# Patient Record
Sex: Male | Born: 1950
Health system: Southern US, Community
[De-identification: ages and names within clinical notes are randomized; demographics above are authoritative.]

## PROBLEM LIST (undated history)

## (undated) DIAGNOSIS — M1990 Unspecified osteoarthritis, unspecified site: Secondary | ICD-10-CM

## (undated) HISTORY — PX: FRACTURE SURGERY: SHX138

---

## 2016-12-25 DIAGNOSIS — D62 Acute posthemorrhagic anemia: Secondary | ICD-10-CM | POA: Diagnosis not present

## 2016-12-25 DIAGNOSIS — K920 Hematemesis: Secondary | ICD-10-CM | POA: Diagnosis not present

## 2016-12-25 DIAGNOSIS — K922 Gastrointestinal hemorrhage, unspecified: Secondary | ICD-10-CM | POA: Diagnosis not present

## 2016-12-25 DIAGNOSIS — I959 Hypotension, unspecified: Secondary | ICD-10-CM

## 2016-12-26 DIAGNOSIS — K920 Hematemesis: Secondary | ICD-10-CM | POA: Diagnosis not present

## 2016-12-26 DIAGNOSIS — K922 Gastrointestinal hemorrhage, unspecified: Secondary | ICD-10-CM | POA: Diagnosis not present

## 2016-12-26 DIAGNOSIS — D62 Acute posthemorrhagic anemia: Secondary | ICD-10-CM | POA: Diagnosis not present

## 2016-12-26 DIAGNOSIS — I959 Hypotension, unspecified: Secondary | ICD-10-CM | POA: Diagnosis not present

## 2016-12-27 DIAGNOSIS — I959 Hypotension, unspecified: Secondary | ICD-10-CM | POA: Diagnosis not present

## 2016-12-27 DIAGNOSIS — K920 Hematemesis: Secondary | ICD-10-CM | POA: Diagnosis not present

## 2016-12-27 DIAGNOSIS — D62 Acute posthemorrhagic anemia: Secondary | ICD-10-CM | POA: Diagnosis not present

## 2016-12-27 DIAGNOSIS — K922 Gastrointestinal hemorrhage, unspecified: Secondary | ICD-10-CM | POA: Diagnosis not present

## 2016-12-28 DIAGNOSIS — K920 Hematemesis: Secondary | ICD-10-CM | POA: Diagnosis not present

## 2016-12-28 DIAGNOSIS — D62 Acute posthemorrhagic anemia: Secondary | ICD-10-CM | POA: Diagnosis not present

## 2016-12-28 DIAGNOSIS — I959 Hypotension, unspecified: Secondary | ICD-10-CM | POA: Diagnosis not present

## 2016-12-28 DIAGNOSIS — K922 Gastrointestinal hemorrhage, unspecified: Secondary | ICD-10-CM | POA: Diagnosis not present

## 2016-12-28 LAB — HM COLONOSCOPY

## 2017-02-09 ENCOUNTER — Other Ambulatory Visit: Payer: Self-pay | Admitting: Orthopedic Surgery

## 2017-03-17 ENCOUNTER — Encounter (HOSPITAL_BASED_OUTPATIENT_CLINIC_OR_DEPARTMENT_OTHER): Payer: Self-pay | Admitting: *Deleted

## 2017-03-24 ENCOUNTER — Ambulatory Visit (HOSPITAL_BASED_OUTPATIENT_CLINIC_OR_DEPARTMENT_OTHER): Admission: RE | Admit: 2017-03-24 | Payer: Non-veteran care | Source: Ambulatory Visit | Admitting: Orthopedic Surgery

## 2017-03-24 HISTORY — DX: Unspecified osteoarthritis, unspecified site: M19.90

## 2017-03-24 SURGERY — ARTHROSCOPY, ANKLE, WITH FUSION
Anesthesia: General | Laterality: Right

## 2018-03-28 DIAGNOSIS — I10 Essential (primary) hypertension: Secondary | ICD-10-CM | POA: Diagnosis not present

## 2018-03-28 DIAGNOSIS — Z0000 Encounter for general adult medical examination without abnormal findings: Secondary | ICD-10-CM | POA: Diagnosis not present

## 2018-04-19 DIAGNOSIS — J208 Acute bronchitis due to other specified organisms: Secondary | ICD-10-CM | POA: Diagnosis not present

## 2018-08-15 DIAGNOSIS — D692 Other nonthrombocytopenic purpura: Secondary | ICD-10-CM | POA: Diagnosis not present

## 2018-08-15 DIAGNOSIS — L821 Other seborrheic keratosis: Secondary | ICD-10-CM | POA: Diagnosis not present

## 2018-08-15 DIAGNOSIS — L570 Actinic keratosis: Secondary | ICD-10-CM | POA: Diagnosis not present

## 2018-08-30 DIAGNOSIS — R1084 Generalized abdominal pain: Secondary | ICD-10-CM | POA: Diagnosis not present

## 2018-08-31 ENCOUNTER — Other Ambulatory Visit: Payer: Self-pay | Admitting: Legal Medicine

## 2018-08-31 DIAGNOSIS — R1084 Generalized abdominal pain: Secondary | ICD-10-CM

## 2018-09-01 ENCOUNTER — Other Ambulatory Visit: Payer: Self-pay | Admitting: Legal Medicine

## 2018-09-05 ENCOUNTER — Ambulatory Visit
Admission: RE | Admit: 2018-09-05 | Discharge: 2018-09-05 | Disposition: A | Discharge status: Home / Self Care | Payer: Non-veteran care | Source: Ambulatory Visit | Attending: Legal Medicine | Admitting: Legal Medicine

## 2018-09-05 DIAGNOSIS — R1084 Generalized abdominal pain: Secondary | ICD-10-CM

## 2018-09-05 DIAGNOSIS — R109 Unspecified abdominal pain: Secondary | ICD-10-CM | POA: Diagnosis not present

## 2018-09-13 DIAGNOSIS — R1084 Generalized abdominal pain: Secondary | ICD-10-CM | POA: Diagnosis not present

## 2018-09-28 DIAGNOSIS — Z6824 Body mass index (BMI) 24.0-24.9, adult: Secondary | ICD-10-CM | POA: Diagnosis not present

## 2018-09-28 DIAGNOSIS — Z23 Encounter for immunization: Secondary | ICD-10-CM | POA: Diagnosis not present

## 2018-09-28 DIAGNOSIS — K219 Gastro-esophageal reflux disease without esophagitis: Secondary | ICD-10-CM | POA: Diagnosis not present

## 2018-09-28 DIAGNOSIS — H906 Mixed conductive and sensorineural hearing loss, bilateral: Secondary | ICD-10-CM | POA: Diagnosis not present

## 2018-09-28 DIAGNOSIS — E782 Mixed hyperlipidemia: Secondary | ICD-10-CM | POA: Diagnosis not present

## 2018-10-03 DIAGNOSIS — L570 Actinic keratosis: Secondary | ICD-10-CM | POA: Diagnosis not present

## 2018-10-03 DIAGNOSIS — L821 Other seborrheic keratosis: Secondary | ICD-10-CM | POA: Diagnosis not present

## 2018-10-12 DIAGNOSIS — Z1211 Encounter for screening for malignant neoplasm of colon: Secondary | ICD-10-CM | POA: Diagnosis not present

## 2019-04-02 DIAGNOSIS — D692 Other nonthrombocytopenic purpura: Secondary | ICD-10-CM | POA: Diagnosis not present

## 2019-04-02 DIAGNOSIS — L821 Other seborrheic keratosis: Secondary | ICD-10-CM | POA: Diagnosis not present

## 2019-04-02 DIAGNOSIS — L570 Actinic keratosis: Secondary | ICD-10-CM | POA: Diagnosis not present

## 2019-06-20 DIAGNOSIS — Z0000 Encounter for general adult medical examination without abnormal findings: Secondary | ICD-10-CM | POA: Diagnosis not present

## 2019-06-20 DIAGNOSIS — Z23 Encounter for immunization: Secondary | ICD-10-CM | POA: Diagnosis not present

## 2019-06-20 DIAGNOSIS — E782 Mixed hyperlipidemia: Secondary | ICD-10-CM | POA: Diagnosis not present

## 2019-06-20 DIAGNOSIS — Z1159 Encounter for screening for other viral diseases: Secondary | ICD-10-CM | POA: Diagnosis not present

## 2019-06-20 DIAGNOSIS — K219 Gastro-esophageal reflux disease without esophagitis: Secondary | ICD-10-CM | POA: Diagnosis not present

## 2019-09-18 DIAGNOSIS — M25462 Effusion, left knee: Secondary | ICD-10-CM | POA: Diagnosis not present

## 2019-09-18 DIAGNOSIS — Z1159 Encounter for screening for other viral diseases: Secondary | ICD-10-CM | POA: Diagnosis not present

## 2019-10-09 DIAGNOSIS — M25462 Effusion, left knee: Secondary | ICD-10-CM | POA: Diagnosis not present

## 2019-10-09 DIAGNOSIS — Z23 Encounter for immunization: Secondary | ICD-10-CM | POA: Diagnosis not present

## 2019-10-30 DIAGNOSIS — M25562 Pain in left knee: Secondary | ICD-10-CM | POA: Diagnosis not present

## 2019-10-31 DIAGNOSIS — L570 Actinic keratosis: Secondary | ICD-10-CM | POA: Diagnosis not present

## 2019-12-24 DIAGNOSIS — Z1159 Encounter for screening for other viral diseases: Secondary | ICD-10-CM | POA: Diagnosis not present

## 2019-12-24 DIAGNOSIS — H906 Mixed conductive and sensorineural hearing loss, bilateral: Secondary | ICD-10-CM | POA: Diagnosis not present

## 2019-12-24 DIAGNOSIS — E782 Mixed hyperlipidemia: Secondary | ICD-10-CM | POA: Diagnosis not present

## 2019-12-24 DIAGNOSIS — K219 Gastro-esophageal reflux disease without esophagitis: Secondary | ICD-10-CM | POA: Diagnosis not present

## 2020-02-26 DIAGNOSIS — H2513 Age-related nuclear cataract, bilateral: Secondary | ICD-10-CM | POA: Diagnosis not present

## 2020-04-29 DIAGNOSIS — C44329 Squamous cell carcinoma of skin of other parts of face: Secondary | ICD-10-CM | POA: Diagnosis not present

## 2020-04-29 DIAGNOSIS — L570 Actinic keratosis: Secondary | ICD-10-CM | POA: Diagnosis not present

## 2020-05-21 DIAGNOSIS — C44329 Squamous cell carcinoma of skin of other parts of face: Secondary | ICD-10-CM | POA: Diagnosis not present

## 2020-05-25 HISTORY — PX: SKIN CANCER EXCISION: SHX779

## 2020-06-24 ENCOUNTER — Encounter: Payer: Self-pay | Admitting: Legal Medicine

## 2020-06-24 ENCOUNTER — Ambulatory Visit (INDEPENDENT_AMBULATORY_CARE_PROVIDER_SITE_OTHER): Payer: Medicare Other | Admitting: Legal Medicine

## 2020-06-24 ENCOUNTER — Other Ambulatory Visit: Payer: Self-pay

## 2020-06-24 VITALS — BP 120/60 | HR 69 | Temp 97.7°F | Resp 17 | Ht 73.0 in | Wt 194.2 lb

## 2020-06-24 DIAGNOSIS — E782 Mixed hyperlipidemia: Secondary | ICD-10-CM | POA: Diagnosis not present

## 2020-06-24 DIAGNOSIS — Z125 Encounter for screening for malignant neoplasm of prostate: Secondary | ICD-10-CM

## 2020-06-24 DIAGNOSIS — K219 Gastro-esophageal reflux disease without esophagitis: Secondary | ICD-10-CM | POA: Diagnosis not present

## 2020-06-24 DIAGNOSIS — H906 Mixed conductive and sensorineural hearing loss, bilateral: Secondary | ICD-10-CM | POA: Diagnosis not present

## 2020-06-24 DIAGNOSIS — K250 Acute gastric ulcer with hemorrhage: Secondary | ICD-10-CM

## 2020-06-24 DIAGNOSIS — K260 Acute duodenal ulcer with hemorrhage: Secondary | ICD-10-CM | POA: Insufficient documentation

## 2020-06-24 DIAGNOSIS — Z8711 Personal history of peptic ulcer disease: Secondary | ICD-10-CM | POA: Insufficient documentation

## 2020-06-24 HISTORY — DX: Mixed hyperlipidemia: E78.2

## 2020-06-24 HISTORY — DX: Acute gastric ulcer with hemorrhage: K25.0

## 2020-06-24 HISTORY — DX: Gastro-esophageal reflux disease without esophagitis: K21.9

## 2020-06-24 HISTORY — DX: Mixed conductive and sensorineural hearing loss, bilateral: H90.6

## 2020-06-24 NOTE — Progress Notes (Signed)
Subjective:  Patient ID: Adrian Allen, male    DOB: January 07, 1951  Age: 69 y.o. MRN: [id number]  Chief Complaint  Patient presents with  . Hyperlipidemia  . Gastroesophageal Reflux    HPI: chronic visit  Patient presents with hyperlipidemia.  Compliance with treatment has been good; patient takes medicines as directed, maintains low cholesterol diet, follows up as directed, and maintains exercise regimen.  Patient is using no meds without problems.   Patient has gastroesophageal reflux symptoms withesophagitis and LTRD.  The symptoms are mild intensity.  Length of symptoms 10 years.  Medicines include OTC.  Complications include none.   No current outpatient medications on file prior to visit.   No current facility-administered medications on file prior to visit.   Past Medical History:  Diagnosis Date  . Acute gastric ulcer with hemorrhage 06/24/2020  . Arthritis    right ankle  . Gastroesophageal reflux disease 06/24/2020  . Mixed conductive and sensorineural hearing loss, bilateral 06/24/2020  . Mixed hyperlipidemia 06/24/2020   Past Surgical History:  Procedure Laterality Date  . FRACTURE SURGERY     right ankle surgery in 1974  . SKIN CANCER EXCISION Left 05/25/2020    Family History  Problem Relation Age of Onset  . Stomach cancer Father   . Heart Problems Brother    Social History   Socioeconomic History  . Marital status: Married    Spouse name: Not on file  . Number of children: 0  . Years of education: Not on file  . Highest education level: Not on file  Occupational History  . Occupation: Retired  Tobacco Use  . Smoking status: Former Smoker    Types: Cigarettes    Quit date: 2019    Years since quitting: 2.5  . Smokeless tobacco: Never Used  Substance and Sexual Activity  . Alcohol use: Not Currently  . Drug use: No  . Sexual activity: Yes    Partners: Female  Other Topics Concern  . Not on file  Social History Narrative  . Not on file    Social Determinants of Health   Financial Resource Strain:   . Difficulty of Paying Living Expenses:   Food Insecurity:   . Worried About Charity fundraiser in the Last Year:   . Arboriculturist in the Last Year:   Transportation Needs:   . Film/video editor (Medical):   Marland Kitchen Lack of Transportation (Non-Medical):   Physical Activity:   . Days of Exercise per Week:   . Minutes of Exercise per Session:   Stress:   . Feeling of Stress :   Social Connections:   . Frequency of Communication with Friends and Family:   . Frequency of Social Gatherings with Friends and Family:   . Attends Religious Services:   . Active Member of Clubs or Organizations:   . Attends Archivist Meetings:   Marland Kitchen Marital Status:     Review of Systems  Constitutional: Negative.   HENT: Negative.   Eyes: Negative.   Respiratory: Negative.   Cardiovascular: Negative.   Gastrointestinal: Negative.   Endocrine: Negative.   Genitourinary: Negative.   Musculoskeletal: Negative.   Skin: Negative.   Neurological: Negative.   Psychiatric/Behavioral: Negative.      Objective:  BP 120/60   Pulse 69   Temp 97.7 F (36.5 C) (Temporal)   Resp 17   Ht 6\' 1"  (1.854 m)   Wt 194 lb 3.2 oz (88.1 kg)   SpO2  98%   BMI 25.62 kg/m   BP/Weight 06/24/2020 03/24/2017 1/60/1093  Systolic BP 235 - -  Diastolic BP 60 - -  Wt. (Lbs) 194.2 - 218  BMI 25.62 28.76 -    Physical Exam Vitals reviewed.  Constitutional:      Appearance: Normal appearance.  HENT:     Head: Normocephalic and atraumatic.     Right Ear: Tympanic membrane normal.     Left Ear: Tympanic membrane normal.     Nose: Nose normal.     Mouth/Throat:     Mouth: Mucous membranes are dry.  Eyes:     Extraocular Movements: Extraocular movements intact.     Pupils: Pupils are equal, round, and reactive to light.  Cardiovascular:     Rate and Rhythm: Normal rate and regular rhythm.     Pulses: Normal pulses.     Heart sounds: Normal  heart sounds.  Pulmonary:     Breath sounds: Normal breath sounds.  Abdominal:     General: Abdomen is flat.     Palpations: Abdomen is soft.  Musculoskeletal:        General: Normal range of motion.     Cervical back: Normal range of motion and neck supple.  Skin:    General: Skin is warm and dry.     Capillary Refill: Capillary refill takes less than 2 seconds.  Neurological:     General: No focal deficit present.     Mental Status: He is alert and oriented to person, place, and time.  Psychiatric:        Mood and Affect: Mood normal.        Thought Content: Thought content normal.        Judgment: Judgment normal.       No results found for: WBC, HGB, HCT, PLT, GLUCOSE, CHOL, TRIG, HDL, LDLDIRECT, LDLCALC, ALT, AST, NA, K, CL, CREATININE, BUN, CO2, TSH, PSA, INR, GLUF, HGBA1C, MICROALBUR    Assessment & Plan:   1. Mixed conductive and sensorineural hearing loss, bilateral Patient has hearing aids.  2. Gastroesophageal reflux disease without esophagitis - CBC with Differential/Platelet - Comprehensive metabolic panel Plan of care was formulated today.  She is doing well.  A plan of care was formulated using patient exam, tests and other sources to optimize care using evidence based information.  Recommend no smoking, no eating after supper, avoid fatty foods, elevate Head of bed, avoid tight fitting clothing.  Continue on OTC.  3. Mixed hyperlipidemia - TSH; Future - Lipid Panel AN INDIVIDUAL CARE PLAN for hyperlipidemia/ cholesterol was established and reinforced today.  The patient's status was assessed using clinical findings on exam, lab and other diagnostic tests. The patient's disease status was assessed based on evidence-based guidelines and found to be well controlled. MEDICATIONS were reviewed. SELF MANAGEMENT GOALS have been discussed and patient's success at attaining the goal of low cholesterol was assessed. RECOMMENDATION given include regular exercise 3  days a week and low cholesterol/low fat diet. CLINICAL SUMMARY including written plan to identify barriers unique to the patient due to social or economic  reasons was discussed.  4. Screening for malignant neoplasm of prostate - PSA Patient has BPH and needs PSA         Follow-up: Return in about 6 months (around 12/25/2020) for chronic.  An After Visit Summary was printed and given to the patient.  [place] [phone]

## 2020-06-26 LAB — CBC WITH DIFFERENTIAL/PLATELET
Basophils Absolute: 0.1 10*3/uL (ref 0.0–0.2)
Basos: 1 %
EOS (ABSOLUTE): 0.2 10*3/uL (ref 0.0–0.4)
Eos: 2 %
Hematocrit: 43.3 % (ref 37.5–51.0)
Hemoglobin: 15.2 g/dL (ref 13.0–17.7)
Immature Grans (Abs): 0 10*3/uL (ref 0.0–0.1)
Immature Granulocytes: 1 %
Lymphocytes Absolute: 2 10*3/uL (ref 0.7–3.1)
Lymphs: 26 %
MCH: 33.3 pg — ABNORMAL HIGH (ref 26.6–33.0)
MCHC: 35.1 g/dL (ref 31.5–35.7)
MCV: 95 fL (ref 79–97)
Monocytes Absolute: 0.7 10*3/uL (ref 0.1–0.9)
Monocytes: 9 %
Neutrophils Absolute: 4.8 10*3/uL (ref 1.4–7.0)
Neutrophils: 61 %
Platelets: 149 10*3/uL — ABNORMAL LOW (ref 150–450)
RBC: 4.57 x10E6/uL (ref 4.14–5.80)
RDW: 11.7 % (ref 11.6–15.4)
WBC: 7.7 10*3/uL (ref 3.4–10.8)

## 2020-06-26 LAB — COMPREHENSIVE METABOLIC PANEL
ALT: 13 IU/L (ref 0–44)
AST: 20 IU/L (ref 0–40)
Albumin/Globulin Ratio: 1.7 (ref 1.2–2.2)
Albumin: 4.3 g/dL (ref 3.8–4.8)
Alkaline Phosphatase: 111 IU/L (ref 48–121)
BUN/Creatinine Ratio: 12 (ref 10–24)
BUN: 15 mg/dL (ref 8–27)
Bilirubin Total: 0.8 mg/dL (ref 0.0–1.2)
CO2: 25 mmol/L (ref 20–29)
Calcium: 9.2 mg/dL (ref 8.6–10.2)
Chloride: 100 mmol/L (ref 96–106)
Creatinine, Ser: 1.22 mg/dL (ref 0.76–1.27)
GFR calc Af Amer: 70 mL/min/{1.73_m2} (ref >59)
GFR calc non Af Amer: 61 mL/min/{1.73_m2} (ref >59)
Globulin, Total: 2.5 g/dL (ref 1.5–4.5)
Glucose: 92 mg/dL (ref 65–99)
Potassium: 5.2 mmol/L (ref 3.5–5.2)
Sodium: 137 mmol/L (ref 134–144)
Total Protein: 6.8 g/dL (ref 6.0–8.5)

## 2020-06-26 LAB — LIPID PANEL
Chol/HDL Ratio: 3.7 ratio (ref 0.0–5.0)
Cholesterol, Total: 153 mg/dL (ref 100–199)
HDL: 41 mg/dL (ref >39)
LDL Chol Calc (NIH): 94 mg/dL (ref 0–99)
Triglycerides: 99 mg/dL (ref 0–149)
VLDL Cholesterol Cal: 18 mg/dL (ref 5–40)

## 2020-06-26 LAB — CARDIOVASCULAR RISK ASSESSMENT

## 2020-06-26 LAB — PSA: Prostate Specific Ag, Serum: 0.6 ng/mL (ref 0.0–4.0)

## 2020-06-26 LAB — TSH: TSH: 1.98 u[IU]/mL (ref 0.450–4.500)

## 2020-06-26 LAB — SPECIMEN STATUS REPORT

## 2020-06-26 NOTE — Progress Notes (Signed)
Cbc OK, kidney and liver tests normal, Cholesterol normal, PSA 0.6 lp

## 2020-06-26 NOTE — Progress Notes (Signed)
TSH normal, continue same dose of thyroid lp

## 2020-09-12 ENCOUNTER — Ambulatory Visit (INDEPENDENT_AMBULATORY_CARE_PROVIDER_SITE_OTHER): Payer: Medicare Other

## 2020-09-12 ENCOUNTER — Encounter: Payer: Self-pay | Admitting: Legal Medicine

## 2020-09-12 DIAGNOSIS — Z23 Encounter for immunization: Secondary | ICD-10-CM

## 2020-09-21 DIAGNOSIS — S42114A Nondisplaced fracture of body of scapula, right shoulder, initial encounter for closed fracture: Secondary | ICD-10-CM | POA: Diagnosis not present

## 2020-09-21 DIAGNOSIS — S27321A Contusion of lung, unilateral, initial encounter: Secondary | ICD-10-CM | POA: Diagnosis not present

## 2020-09-21 DIAGNOSIS — S91312A Laceration without foreign body, left foot, initial encounter: Secondary | ICD-10-CM | POA: Diagnosis not present

## 2020-09-21 DIAGNOSIS — S299XXA Unspecified injury of thorax, initial encounter: Secondary | ICD-10-CM | POA: Diagnosis not present

## 2020-09-21 DIAGNOSIS — S0081XA Abrasion of other part of head, initial encounter: Secondary | ICD-10-CM | POA: Diagnosis not present

## 2020-09-21 DIAGNOSIS — S82832A Other fracture of upper and lower end of left fibula, initial encounter for closed fracture: Secondary | ICD-10-CM | POA: Diagnosis not present

## 2020-09-21 DIAGNOSIS — S0990XA Unspecified injury of head, initial encounter: Secondary | ICD-10-CM | POA: Diagnosis not present

## 2020-09-21 DIAGNOSIS — S82402A Unspecified fracture of shaft of left fibula, initial encounter for closed fracture: Secondary | ICD-10-CM | POA: Diagnosis not present

## 2020-09-21 DIAGNOSIS — S92335A Nondisplaced fracture of third metatarsal bone, left foot, initial encounter for closed fracture: Secondary | ICD-10-CM | POA: Diagnosis not present

## 2020-09-21 DIAGNOSIS — S92315B Nondisplaced fracture of first metatarsal bone, left foot, initial encounter for open fracture: Secondary | ICD-10-CM | POA: Diagnosis not present

## 2020-09-21 DIAGNOSIS — Z043 Encounter for examination and observation following other accident: Secondary | ICD-10-CM | POA: Diagnosis not present

## 2020-09-21 DIAGNOSIS — S0091XA Abrasion of unspecified part of head, initial encounter: Secondary | ICD-10-CM | POA: Diagnosis not present

## 2020-09-21 DIAGNOSIS — S42101A Fracture of unspecified part of scapula, right shoulder, initial encounter for closed fracture: Secondary | ICD-10-CM | POA: Diagnosis not present

## 2020-09-21 DIAGNOSIS — S3991XA Unspecified injury of abdomen, initial encounter: Secondary | ICD-10-CM | POA: Diagnosis not present

## 2020-09-23 DIAGNOSIS — S42114A Nondisplaced fracture of body of scapula, right shoulder, initial encounter for closed fracture: Secondary | ICD-10-CM | POA: Diagnosis not present

## 2020-09-23 DIAGNOSIS — S92335A Nondisplaced fracture of third metatarsal bone, left foot, initial encounter for closed fracture: Secondary | ICD-10-CM | POA: Diagnosis not present

## 2020-09-23 DIAGNOSIS — S82832A Other fracture of upper and lower end of left fibula, initial encounter for closed fracture: Secondary | ICD-10-CM | POA: Diagnosis not present

## 2020-09-23 DIAGNOSIS — M1990 Unspecified osteoarthritis, unspecified site: Secondary | ICD-10-CM | POA: Insufficient documentation

## 2020-09-23 HISTORY — DX: Nondisplaced fracture of third metatarsal bone, left foot, initial encounter for closed fracture: S92.335A

## 2020-09-23 HISTORY — DX: Nondisplaced fracture of body of scapula, right shoulder, initial encounter for closed fracture: S42.114A

## 2020-09-23 HISTORY — DX: Other fracture of upper and lower end of left fibula, initial encounter for closed fracture: S82.832A

## 2020-09-26 ENCOUNTER — Ambulatory Visit (INDEPENDENT_AMBULATORY_CARE_PROVIDER_SITE_OTHER): Payer: Medicare Other | Admitting: Legal Medicine

## 2020-09-26 ENCOUNTER — Encounter: Payer: Self-pay | Admitting: Legal Medicine

## 2020-09-26 ENCOUNTER — Other Ambulatory Visit: Payer: Self-pay

## 2020-09-26 VITALS — BP 130/70 | HR 74 | Temp 98.2°F | Resp 17 | Ht 73.0 in | Wt 194.0 lb

## 2020-09-26 DIAGNOSIS — S82832A Other fracture of upper and lower end of left fibula, initial encounter for closed fracture: Secondary | ICD-10-CM

## 2020-09-26 DIAGNOSIS — S92335A Nondisplaced fracture of third metatarsal bone, left foot, initial encounter for closed fracture: Secondary | ICD-10-CM

## 2020-09-26 DIAGNOSIS — S42114A Nondisplaced fracture of body of scapula, right shoulder, initial encounter for closed fracture: Secondary | ICD-10-CM | POA: Diagnosis not present

## 2020-09-26 MED ORDER — HYDROMORPHONE HCL 2 MG PO TABS: 2.0000 mg | ORAL_TABLET | ORAL | 0 refills | Status: DC | PRN

## 2020-09-26 NOTE — Progress Notes (Signed)
Subjective:  Patient ID: Adrian Allen, male    DOB: [birth year]  Age: 69 y.o. MRN: [id number]  Chief Complaint  Patient presents with   Follow-up    Patient went to ED on 09/21/2020.   hospital noted reviewed  HPI: patient fell off his camper on 09/21/2020 and has left fibular fracture closed,right scapular fracture.  HE HAS CUT ON TOE.  He has multiped abrasion on scalp, no infection. No LOC  he has seen Dr. Ronnie Derby for his fractures. Current Outpatient Medications on File Prior to Visit  Medication Sig Dispense Refill   cephALEXin (KEFLEX) 500 MG capsule Take 500 mg by mouth every 6 (six) hours.     ondansetron (ZOFRAN-ODT) 4 MG disintegrating tablet Take 4 mg by mouth every 6 (six) hours as needed.     oxyCODONE-acetaminophen (PERCOCET/ROXICET) 5-325 MG tablet Take 1 tablet by mouth every 4 (four) hours.     No current facility-administered medications on file prior to visit.   Past Medical History:  Diagnosis Date   Acute gastric ulcer with hemorrhage 06/24/2020   Arthritis    right ankle   Gastroesophageal reflux disease 06/24/2020   Mixed conductive and sensorineural hearing loss, bilateral 06/24/2020   Mixed hyperlipidemia 06/24/2020   Past Surgical History:  Procedure Laterality Date   FRACTURE SURGERY     right ankle surgery in [place] Left 05/25/2020    Family History  Problem Relation Age of Onset   Stomach cancer Father    Heart Problems Brother    Social History   Socioeconomic History   Marital status: Married    Spouse name: Not on file   Number of children: 0   Years of education: Not on file   Highest education level: Not on file  Occupational History   Occupation: Retired  Tobacco Use   Smoking status: Former Smoker    Types: Cigarettes    Quit date: 2019    Years since quitting: 2.7   Smokeless tobacco: Never Used  Substance and Sexual Activity   Alcohol use: Not Currently   Drug use: No   Sexual  activity: Yes    Partners: Female  Other Topics Concern   Not on file  Social History Narrative   Not on file   Social Determinants of Health   Financial Resource Strain:    Difficulty of Paying Living Expenses: Not on file  Food Insecurity:    Worried About Charity fundraiser in the Last Year: Not on file   [employer] of Food in the Last Year: Not on file  Transportation Needs:    Lack of Transportation (Medical): Not on file   Lack of Transportation (Non-Medical): Not on file  Physical Activity:    Days of Exercise per Week: Not on file   Minutes of Exercise per Session: Not on file  Stress:    Feeling of Stress : Not on file  Social Connections:    Frequency of Communication with Friends and Family: Not on file   Frequency of Social Gatherings with Friends and Family: Not on file   Attends Religious Services: Not on file   Active Member of Clubs or Organizations: Not on file   Attends Archivist Meetings: Not on file   Marital Status: Not on file    Review of Systems  HENT: Negative.   Eyes: Negative.   Respiratory: Negative.   Cardiovascular: Negative.   Genitourinary: Negative.   Musculoskeletal: Positive for  arthralgias (left fibula and right scapula).  Neurological: Negative.   Psychiatric/Behavioral: Negative.      Objective:  BP 130/70    Pulse 74    Temp 98.2 F (36.8 C)    Resp 17    Ht 6\' 1"  (1.854 m)    Wt 194 lb (88 kg)    BMI 25.60 kg/m   BP/Weight 09/26/2020 01/23/2682 03/20/9621  Systolic BP 297 989 -  Diastolic BP 70 60 -  Wt. (Lbs) 194 194.2 -  BMI 25.6 25.62 28.76    Physical Exam Vitals reviewed.  Constitutional:      Appearance: Normal appearance.  HENT:     Head: Normocephalic and atraumatic.     Right Ear: Tympanic membrane normal.     Left Ear: Tympanic membrane normal.  Eyes:     Extraocular Movements: Extraocular movements intact.     Conjunctiva/sclera: Conjunctivae normal.     Pupils: Pupils are equal,  round, and reactive to light.  Cardiovascular:     Rate and Rhythm: Normal rate and regular rhythm.     Pulses: Normal pulses.     Heart sounds: Normal heart sounds.  Pulmonary:     Effort: Pulmonary effort is normal.     Breath sounds: Normal breath sounds.  Musculoskeletal:        General: Tenderness present.       Arms:       Back:       Legs:     Comments: Pain right scapula, large bruise left flank, fracture left fibula and is n walker boot  Skin:    Findings: Bruising present.  Neurological:     General: No focal deficit present.     Mental Status: He is alert and oriented to person, place, and time.       Lab Results  Component Value Date   WBC 7.7 06/24/2020   HGB 15.2 06/24/2020   HCT 43.3 06/24/2020   PLT 149 (L) 06/24/2020   GLUCOSE 92 06/24/2020   CHOL 153 06/24/2020   TRIG 99 06/24/2020   HDL 41 06/24/2020   LDLCALC 94 06/24/2020   ALT 13 06/24/2020   AST 20 06/24/2020   NA 137 06/24/2020   K 5.2 06/24/2020   CL 100 06/24/2020   CREATININE 1.22 06/24/2020   BUN 15 06/24/2020   CO2 25 06/24/2020   TSH 1.980 06/24/2020      Assessment & Plan:   1. Closed fracture of proximal end of left fibula, unspecified fracture morphology, initial encounter - HYDROmorphone (DILAUDID) 2 MG tablet; Take 1 tablet (2 mg total) by mouth every 4 (four) hours as needed for severe pain.  Dispense: 30 tablet; Refill: 0 Patient has left fibular fracture and is in a long walker boot.  He is seeing Dr. Ronnie Derby.  2. Closed nondisplaced fracture of body of right scapula, initial encounter - HYDROmorphone (DILAUDID) 2 MG tablet; Take 1 tablet (2 mg total) by mouth every 4 (four) hours as needed for severe pain.  Dispense: 30 tablet; Refill: 0 Patient continues to have pain in right scapul.  We discussed the need to move shoulder and neck as tolerated.  3. Closed nondisplaced fracture of third metatarsal bone of left foot, initial encounter - HYDROmorphone (DILAUDID) 2 MG  tablet; Take 1 tablet (2 mg total) by mouth every 4 (four) hours as needed for severe pain.  Dispense: 30 tablet; Refill: 0 The toe is healing and sutures removed by nurse.    Meds ordered this  encounter  Medications   HYDROmorphone (DILAUDID) 2 MG tablet    Sig: Take 1 tablet (2 mg total) by mouth every 4 (four) hours as needed for severe pain.    Dispense:  30 tablet    Refill:  0       Follow-up: Return in about 1 week (around 10/03/2020) for follow up.  An After Visit Summary was printed and given to the patient.  [place] [phone]

## 2020-09-28 ENCOUNTER — Encounter: Payer: Self-pay | Admitting: Legal Medicine

## 2020-09-29 ENCOUNTER — Other Ambulatory Visit: Payer: Self-pay | Admitting: Legal Medicine

## 2020-09-29 ENCOUNTER — Telehealth: Payer: Self-pay

## 2020-09-29 DIAGNOSIS — S82832A Other fracture of upper and lower end of left fibula, initial encounter for closed fracture: Secondary | ICD-10-CM

## 2020-09-29 MED ORDER — OXYCODONE-ACETAMINOPHEN 5-325 MG PO TABS: 1.0000 | ORAL_TABLET | ORAL | 0 refills | Status: DC

## 2020-09-29 NOTE — Telephone Encounter (Signed)
Patient called and he said that any pharmacy that he has called has hydromorphone. He asked if you can send oxycodone to [facility].

## 2020-10-03 ENCOUNTER — Other Ambulatory Visit: Payer: Self-pay

## 2020-10-03 ENCOUNTER — Ambulatory Visit (INDEPENDENT_AMBULATORY_CARE_PROVIDER_SITE_OTHER): Payer: Medicare Other | Admitting: Legal Medicine

## 2020-10-03 ENCOUNTER — Encounter: Payer: Self-pay | Admitting: Legal Medicine

## 2020-10-03 VITALS — BP 112/60 | HR 66 | Temp 97.7°F | Resp 16 | Ht 73.0 in | Wt 195.2 lb

## 2020-10-03 DIAGNOSIS — S42114D Nondisplaced fracture of body of scapula, right shoulder, subsequent encounter for fracture with routine healing: Secondary | ICD-10-CM

## 2020-10-03 DIAGNOSIS — S82832D Other fracture of upper and lower end of left fibula, subsequent encounter for closed fracture with routine healing: Secondary | ICD-10-CM | POA: Diagnosis not present

## 2020-10-03 NOTE — Progress Notes (Signed)
Subjective:  Patient ID: Adrian Allen, male    DOB: 01-18-1951  Age: 69 y.o. MRN: [id number]  Chief Complaint  Patient presents with  . Leg Injury    1 week follow up-left leg-pt states right hip is bruised wants you to look at that happened with his injury  hospital noted reviewed  HPI: patient fell off his camper on 09/21/2020 and has left fibular fracture closed,right scapular fracture.  HE HAS CUT ON TOE.  He has multiped abrasion on scalp, no infection. No LOC  he has seen Dr. Ronnie Derby for his fractures.  On follow up 10/03/2020, his right side bruise bigger but no pain.  Abdomen. The left foot wounds are well healed.  He is ambulating with walker boot.  The laceration on foot is well healed. Current Outpatient Medications on File Prior to Visit  Medication Sig Dispense Refill  . cephALEXin (KEFLEX) 500 MG capsule Take 500 mg by mouth every 6 (six) hours.    . ondansetron (ZOFRAN-ODT) 4 MG disintegrating tablet Take 4 mg by mouth every 6 (six) hours as needed.    Marland Kitchen oxyCODONE-acetaminophen (PERCOCET/ROXICET) 5-325 MG tablet Take 1 tablet by mouth every 4 (four) hours. 30 tablet 0   No current facility-administered medications on file prior to visit.   Past Medical History:  Diagnosis Date  . Acute gastric ulcer with hemorrhage 06/24/2020  . Arthritis    right ankle  . Gastroesophageal reflux disease 06/24/2020  . Mixed conductive and sensorineural hearing loss, bilateral 06/24/2020  . Mixed hyperlipidemia 06/24/2020   Past Surgical History:  Procedure Laterality Date  . FRACTURE SURGERY     right ankle surgery in 1974  . SKIN CANCER EXCISION Left 05/25/2020    Family History  Problem Relation Age of Onset  . Stomach cancer Father   . Heart Problems Brother    Social History   Socioeconomic History  . Marital status: Married    Spouse name: Not on file  . Number of children: 0  . Years of education: Not on file  . Highest education level: Not on file  Occupational  History  . Occupation: Retired  Tobacco Use  . Smoking status: Former Smoker    Types: Cigarettes    Quit date: 2019    Years since quitting: 2.7  . Smokeless tobacco: Never Used  Substance and Sexual Activity  . Alcohol use: Not Currently  . Drug use: No  . Sexual activity: Yes    Partners: Female  Other Topics Concern  . Not on file  Social History Narrative  . Not on file   Social Determinants of Health   Financial Resource Strain:   . Difficulty of Paying Living Expenses: Not on file  Food Insecurity:   . Worried About Charity fundraiser in the Last Year: Not on file  . Ran Out of Food in the Last Year: Not on file  Transportation Needs:   . Lack of Transportation (Medical): Not on file  . Lack of Transportation (Non-Medical): Not on file  Physical Activity:   . Days of Exercise per Week: Not on file  . Minutes of Exercise per Session: Not on file  Stress:   . Feeling of Stress : Not on file  Social Connections:   . Frequency of Communication with Friends and Family: Not on file  . Frequency of Social Gatherings with Friends and Family: Not on file  . Attends Religious Services: Not on file  . Active Member of Clubs  or Organizations: Not on file  . Attends Archivist Meetings: Not on file  . Marital Status: Not on file    Review of Systems  HENT: Negative.   Eyes: Negative.   Respiratory: Negative.   Cardiovascular: Negative.   Genitourinary: Negative.   Musculoskeletal: Positive for arthralgias (left fibula and right scapula).  Skin:       Bruising right flank  Neurological: Negative.   Psychiatric/Behavioral: Negative.      Objective:  BP 112/60   Pulse 66   Temp 97.7 F (36.5 C)   Resp 16   Ht 6\' 1"  (1.854 m)   Wt 195 lb 3.2 oz (88.5 kg)   SpO2 100%   BMI 25.75 kg/m   BP/Weight 10/03/2020 96/06/8937 1/0/1751  Systolic BP 025 852 778  Diastolic BP 60 70 60  Wt. (Lbs) 195.2 194 194.2  BMI 25.75 25.6 25.62    Physical  Exam Vitals reviewed.  Constitutional:      Appearance: Normal appearance.  HENT:     Head: Normocephalic and atraumatic.     Right Ear: Tympanic membrane normal.     Left Ear: Tympanic membrane normal.  Eyes:     Extraocular Movements: Extraocular movements intact.     Conjunctiva/sclera: Conjunctivae normal.     Pupils: Pupils are equal, round, and reactive to light.  Cardiovascular:     Rate and Rhythm: Normal rate and regular rhythm.     Pulses: Normal pulses.     Heart sounds: Normal heart sounds.  Pulmonary:     Effort: Pulmonary effort is normal.     Breath sounds: Normal breath sounds.  Musculoskeletal:        General: Tenderness present.       Arms:       Back:       Legs:     Comments: Pain right scapula but it is less and he can move shoulder, large bruise left flank, fracture left fibula and is in walker boot  Skin:    Findings: Bruising present.  Neurological:     General: No focal deficit present.     Mental Status: He is alert and oriented to person, place, and time.       Lab Results  Component Value Date   WBC 7.7 06/24/2020   HGB 15.2 06/24/2020   HCT 43.3 06/24/2020   PLT 149 (L) 06/24/2020   GLUCOSE 92 06/24/2020   CHOL 153 06/24/2020   TRIG 99 06/24/2020   HDL 41 06/24/2020   LDLCALC 94 06/24/2020   ALT 13 06/24/2020   AST 20 06/24/2020   NA 137 06/24/2020   K 5.2 06/24/2020   CL 100 06/24/2020   CREATININE 1.22 06/24/2020   BUN 15 06/24/2020   CO2 25 06/24/2020   TSH 1.980 06/24/2020      Assessment & Plan:   1. Closed fracture of proximal end of left fibula, unspecified fracture morphology, subsequentencounter Patient has left fibular fracture and is in a long walker boot.  He is seeing Dr. Ronnie Derby.  2. Closed nondisplaced fracture of body of right scapula, Subsequent encounter -Patient continues to have pain in right scapul. The pain is lessening and his movements are mor free . We discussed the need to move shoulder and neck  as tolerated.  3. Closed nondisplaced fracture of third metatarsal bone of left foot, subsequent encounter The toe is healing well.         Follow-up: Return in about 2 weeks (around 10/17/2020).  An After Visit Summary was printed and given to the patient.  [place] [phone]

## 2020-10-23 DIAGNOSIS — S92335D Nondisplaced fracture of third metatarsal bone, left foot, subsequent encounter for fracture with routine healing: Secondary | ICD-10-CM | POA: Diagnosis not present

## 2020-10-23 DIAGNOSIS — S82832D Other fracture of upper and lower end of left fibula, subsequent encounter for closed fracture with routine healing: Secondary | ICD-10-CM | POA: Diagnosis not present

## 2020-10-23 DIAGNOSIS — S82402D Unspecified fracture of shaft of left fibula, subsequent encounter for closed fracture with routine healing: Secondary | ICD-10-CM | POA: Diagnosis not present

## 2020-11-03 DIAGNOSIS — Z85828 Personal history of other malignant neoplasm of skin: Secondary | ICD-10-CM | POA: Diagnosis not present

## 2020-11-03 DIAGNOSIS — L570 Actinic keratosis: Secondary | ICD-10-CM | POA: Diagnosis not present

## 2020-11-03 DIAGNOSIS — D692 Other nonthrombocytopenic purpura: Secondary | ICD-10-CM | POA: Diagnosis not present

## 2020-11-03 DIAGNOSIS — L905 Scar conditions and fibrosis of skin: Secondary | ICD-10-CM | POA: Diagnosis not present

## 2020-11-04 ENCOUNTER — Ambulatory Visit (INDEPENDENT_AMBULATORY_CARE_PROVIDER_SITE_OTHER): Payer: Medicare Other

## 2020-11-04 ENCOUNTER — Other Ambulatory Visit: Payer: Self-pay

## 2020-11-04 DIAGNOSIS — Z23 Encounter for immunization: Secondary | ICD-10-CM | POA: Diagnosis not present

## 2020-11-04 NOTE — Progress Notes (Signed)
   Covid-19 Vaccination Clinic  Name:  Kawhi Diebold    MRN: [id number] DOB: [birth year]  11/04/2020  Mr. Golz was observed post Covid-19 immunization for 15 minutes without incident. He was provided with Vaccine Information Sheet and instruction to access the V-Safe system.   Mr. Esco was instructed to call 911 with any severe reactions post vaccine: Marland Kitchen Difficulty breathing  . Swelling of face and throat  . A fast heartbeat  . A bad rash all over body  . Dizziness and weakness

## 2020-11-20 ENCOUNTER — Ambulatory Visit: Payer: Medicare Other | Admitting: Podiatry

## 2020-11-20 ENCOUNTER — Ambulatory Visit: Payer: Medicare Other

## 2020-11-20 ENCOUNTER — Other Ambulatory Visit: Payer: Self-pay

## 2020-11-20 ENCOUNTER — Encounter: Payer: Self-pay | Admitting: Podiatry

## 2020-11-20 DIAGNOSIS — M79609 Pain in unspecified limb: Secondary | ICD-10-CM | POA: Diagnosis not present

## 2020-11-20 DIAGNOSIS — B351 Tinea unguium: Secondary | ICD-10-CM

## 2020-11-20 DIAGNOSIS — M2011 Hallux valgus (acquired), right foot: Secondary | ICD-10-CM

## 2020-11-20 DIAGNOSIS — M2012 Hallux valgus (acquired), left foot: Secondary | ICD-10-CM

## 2020-11-20 NOTE — Progress Notes (Signed)
  Subjective:  Patient ID: Adrian Allen, male    DOB: [birth year],  MRN: [id number]  Chief Complaint  Patient presents with  . Nail Problem    the toenails are curling under and red at the base of the toes and the wife can not cut them  . Callouses    callus on the ball of the right foot     69 y.o. male presents with the above complaint. History confirmed with patient.   Objective:  Physical Exam: warm, good capillary refill, nail exam onychomycosis of the toenails, no trophic changes or ulcerative lesions. DP pulses palpable, PT pulses palpable and protective sensation intact HPKs right 5th MPJ, 1st MPJ, hallux  No images are attached to the encounter.  Assessment:   1. Valgus deformity of both great toes   2. Pain due to onychomycosis of nail    Plan:  Patient was evaluated and treated and all questions answered.  Onychomycosis and Onychodystrophy  -Eduated on self-care and options for care including pedicures. Advised of non-coverage for routine nail care. -Nails palliatively debrided secondary to pain  Procedure: Nail Debridement Type of Debridement: manual, sharp debridement. Instrumentation: Nail nipper, rotary burr. Number of Nails: 10  No follow-ups on file.

## 2020-12-25 ENCOUNTER — Other Ambulatory Visit: Payer: Self-pay

## 2020-12-25 ENCOUNTER — Encounter: Payer: Self-pay | Admitting: Legal Medicine

## 2020-12-25 ENCOUNTER — Ambulatory Visit (INDEPENDENT_AMBULATORY_CARE_PROVIDER_SITE_OTHER): Payer: Medicare Other | Admitting: Legal Medicine

## 2020-12-25 VITALS — BP 138/78 | HR 63 | Temp 97.3°F | Resp 16 | Ht 73.0 in | Wt 211.0 lb

## 2020-12-25 DIAGNOSIS — K219 Gastro-esophageal reflux disease without esophagitis: Secondary | ICD-10-CM

## 2020-12-25 DIAGNOSIS — H906 Mixed conductive and sensorineural hearing loss, bilateral: Secondary | ICD-10-CM | POA: Diagnosis not present

## 2020-12-25 DIAGNOSIS — E782 Mixed hyperlipidemia: Secondary | ICD-10-CM

## 2020-12-25 NOTE — Progress Notes (Signed)
Subjective:  Patient ID: Adrian Allen, male    DOB: [birth year]  Age: 70 y.o. MRN: [id number]  Chief Complaint  Patient presents with  . Hyperlipidemia    HPI: Chronic visit  Patient has gastroesophageal reflux symptoms withesophagitis and LTRD.  The symptoms are mild intensity.  Length of symptoms 10 years.  Medicines include otc.  Complications include none.  Patient presents with hyperlipidemia.  Compliance with treatment has been good; patient takes medicines as directed, maintains low cholesterol diet, follows up as directed, and maintains exercise regimen.  Patient is using none without problems.   No current outpatient medications on file prior to visit.   No current facility-administered medications on file prior to visit.   Past Medical History:  Diagnosis Date  . Acute gastric ulcer with hemorrhage 06/24/2020  . Arthritis    right ankle  . Gastroesophageal reflux disease 06/24/2020  . Mixed conductive and sensorineural hearing loss, bilateral 06/24/2020  . Mixed hyperlipidemia 06/24/2020   Past Surgical History:  Procedure Laterality Date  . FRACTURE SURGERY     right ankle surgery in 1974  . SKIN CANCER EXCISION Left 05/25/2020    Family History  Problem Relation Age of Onset  . Stomach cancer Father   . Heart Problems Brother    Social History   Socioeconomic History  . Marital status: Married    Spouse name: Not on file  . Number of children: 0  . Years of education: Not on file  . Highest education level: Not on file  Occupational History  . Occupation: Retired  Tobacco Use  . Smoking status: Former Smoker    Types: Cigarettes    Quit date: 2019    Years since quitting: 3.0  . Smokeless tobacco: Never Used  Substance and Sexual Activity  . Alcohol use: Not Currently  . Drug use: No  . Sexual activity: Yes    Partners: Female  Other Topics Concern  . Not on file  Social History Narrative  . Not on file   Social Determinants of Health    Financial Resource Strain: Not on file  Food Insecurity: Not on file  Transportation Needs: Not on file  Physical Activity: Not on file  Stress: Not on file  Social Connections: Not on file    Review of Systems  Constitutional: Negative for activity change, fatigue and fever.  HENT: Negative for congestion, ear pain and sinus pain.   Eyes: Negative for visual disturbance.  Respiratory: Negative for apnea, cough and wheezing.   Cardiovascular: Negative for chest pain, palpitations and leg swelling.  Gastrointestinal: Negative for abdominal distention and abdominal pain.  Endocrine: Negative for polyuria.  Genitourinary: Positive for urgency. Negative for difficulty urinating and dysuria.  Musculoskeletal: Negative for arthralgias and back pain.  Skin: Negative.   Neurological: Negative.   Psychiatric/Behavioral: Negative.      Objective:  BP 138/78   Pulse 63   Temp (!) 97.3 F (36.3 C)   Resp 16   Ht 6\' 1"  (1.854 m)   Wt 211 lb (95.7 kg)   SpO2 98%   BMI 27.84 kg/m   BP/Weight 12/25/2020 10/03/2020 [id number]  Systolic BP 0000000 [id number]  Diastolic BP 78 60 70  Wt. (Lbs) 211 195.2 194  BMI 27.84 25.75 25.6    Physical Exam Vitals reviewed.  Constitutional:      General: He is not in acute distress.    Appearance: Normal appearance.  HENT:     Right Ear: Tympanic membrane  normal.     Left Ear: Tympanic membrane normal.     Nose: Nose normal.     Mouth/Throat:     Mouth: Mucous membranes are moist.     Pharynx: Oropharynx is clear.  Eyes:     Extraocular Movements: Extraocular movements intact.     Conjunctiva/sclera: Conjunctivae normal.     Pupils: Pupils are equal, round, and reactive to light.  Cardiovascular:     Rate and Rhythm: Normal rate and regular rhythm.     Pulses: Normal pulses.     Heart sounds: Normal heart sounds. No murmur heard. No gallop.   Pulmonary:     Effort: Pulmonary effort is normal.     Breath sounds: Normal breath sounds.   Abdominal:     General: Abdomen is flat. Bowel sounds are normal. There is no distension.     Palpations: Abdomen is soft.     Tenderness: There is no abdominal tenderness.  Musculoskeletal:        General: No swelling or deformity. Normal range of motion.     Cervical back: Normal range of motion and neck supple. No rigidity.  Skin:    General: Skin is warm and dry.     Capillary Refill: Capillary refill takes less than 2 seconds.  Neurological:     General: No focal deficit present.     Mental Status: He is alert and oriented to person, place, and time. Mental status is at baseline.  Psychiatric:        Mood and Affect: Mood normal.        Behavior: Behavior normal.        Thought Content: Thought content normal.       Lab Results  Component Value Date   WBC 7.7 06/24/2020   HGB 15.2 06/24/2020   HCT 43.3 06/24/2020   PLT 149 (L) 06/24/2020   GLUCOSE 92 06/24/2020   CHOL 153 06/24/2020   TRIG 99 06/24/2020   HDL 41 06/24/2020   LDLCALC 94 06/24/2020   ALT 13 06/24/2020   AST 20 06/24/2020   NA 137 06/24/2020   K 5.2 06/24/2020   CL 100 06/24/2020   CREATININE 1.22 06/24/2020   BUN 15 06/24/2020   CO2 25 06/24/2020   TSH 1.980 06/24/2020      Assessment & Plan:  Diagnoses and all orders for this visit: Mixed hyperlipidemia -     CBC with Differential/Platelet -     Comprehensive metabolic panel -     Lipid panel AN INDIVIDUAL CARE PLAN for hyperlipidemia/ cholesterol was established and reinforced today.  The patient's status was assessed using clinical findings on exam, lab and other diagnostic tests. The patient's disease status was assessed based on evidence-based guidelines and found to be well controlled. MEDICATIONS were reviewed. SELF MANAGEMENT GOALS have been discussed and patient's success at attaining the goal of low cholesterol was assessed. RECOMMENDATION given include regular exercise 3 days a week and low cholesterol/low fat diet. CLINICAL  SUMMARY including written plan to identify barriers unique to the patient due to social or economic  reasons was discussed.  Mixed conductive and sensorineural hearing loss, bilateral Patient has hearing loss and wants to get hearing aids.  Gastroesophageal reflux disease without esophagitis -     CBC with Differential/Platelet -     Comprehensive metabolic panel Plan of care was formulated today.  She is doing well.  A plan of care was formulated using patient exam, tests and other sources to optimize  care using evidence based information.  Recommend no smoking, no eating after supper, avoid fatty foods, elevate Head of bed, avoid tight fitting clothing.  Continue on OTC prilosec.        I spent 20 minutes dedicated to the care of this patient on the date of this encounter to include face-to-face time with the patient, as well as:  Follow-up: Return in about 6 months (around 06/24/2021), or fasting.  An After Visit Summary was printed and given to the patient.  Brent Bulla, MD [facility] [phone]

## 2020-12-26 LAB — COMPREHENSIVE METABOLIC PANEL
ALT: 13 IU/L (ref 0–44)
AST: 19 IU/L (ref 0–40)
Albumin/Globulin Ratio: 1.5 (ref 1.2–2.2)
Albumin: 4 g/dL (ref 3.8–4.8)
Alkaline Phosphatase: 165 IU/L — ABNORMAL HIGH (ref 44–121)
BUN/Creatinine Ratio: 13 (ref 10–24)
BUN: 18 mg/dL (ref 8–27)
Bilirubin Total: 0.5 mg/dL (ref 0.0–1.2)
CO2: 24 mmol/L (ref 20–29)
Calcium: 9.5 mg/dL (ref 8.6–10.2)
Chloride: 105 mmol/L (ref 96–106)
Creatinine, Ser: 1.34 mg/dL — ABNORMAL HIGH (ref 0.76–1.27)
GFR calc Af Amer: 63 mL/min/{1.73_m2} (ref >59)
GFR calc non Af Amer: 54 mL/min/{1.73_m2} — ABNORMAL LOW (ref >59)
Globulin, Total: 2.6 g/dL (ref 1.5–4.5)
Glucose: 97 mg/dL (ref 65–99)
Potassium: 4.6 mmol/L (ref 3.5–5.2)
Sodium: 142 mmol/L (ref 134–144)
Total Protein: 6.6 g/dL (ref 6.0–8.5)

## 2020-12-26 LAB — CBC WITH DIFFERENTIAL/PLATELET
Basophils Absolute: 0.1 10*3/uL (ref 0.0–0.2)
Basos: 1 %
EOS (ABSOLUTE): 0.3 10*3/uL (ref 0.0–0.4)
Eos: 4 %
Hematocrit: 41 % (ref 37.5–51.0)
Hemoglobin: 14.1 g/dL (ref 13.0–17.7)
Immature Grans (Abs): 0 10*3/uL (ref 0.0–0.1)
Immature Granulocytes: 1 %
Lymphocytes Absolute: 2 10*3/uL (ref 0.7–3.1)
Lymphs: 27 %
MCH: 32.3 pg (ref 26.6–33.0)
MCHC: 34.4 g/dL (ref 31.5–35.7)
MCV: 94 fL (ref 79–97)
Monocytes Absolute: 0.7 10*3/uL (ref 0.1–0.9)
Monocytes: 9 %
Neutrophils Absolute: 4.3 10*3/uL (ref 1.4–7.0)
Neutrophils: 58 %
Platelets: 159 10*3/uL (ref 150–450)
RBC: 4.37 x10E6/uL (ref 4.14–5.80)
RDW: 11.7 % (ref 11.6–15.4)
WBC: 7.2 10*3/uL (ref 3.4–10.8)

## 2020-12-26 LAB — LIPID PANEL
Chol/HDL Ratio: 3.8 ratio (ref 0.0–5.0)
Cholesterol, Total: 180 mg/dL (ref 100–199)
HDL: 47 mg/dL (ref >39)
LDL Chol Calc (NIH): 110 mg/dL — ABNORMAL HIGH (ref 0–99)
Triglycerides: 126 mg/dL (ref 0–149)
VLDL Cholesterol Cal: 23 mg/dL (ref 5–40)

## 2020-12-26 LAB — CARDIOVASCULAR RISK ASSESSMENT

## 2020-12-26 NOTE — Progress Notes (Signed)
CBC normal, renal tests now stage 3 slightly lower than previously, we need to watch, liver tests normal, LDL high at 110 statin can help  lower atherosclerosis,  lp

## 2021-03-03 DIAGNOSIS — M7541 Impingement syndrome of right shoulder: Secondary | ICD-10-CM | POA: Diagnosis not present

## 2021-03-03 DIAGNOSIS — M25511 Pain in right shoulder: Secondary | ICD-10-CM | POA: Diagnosis not present

## 2021-03-09 DIAGNOSIS — M25511 Pain in right shoulder: Secondary | ICD-10-CM | POA: Diagnosis not present

## 2021-03-11 DIAGNOSIS — M25511 Pain in right shoulder: Secondary | ICD-10-CM | POA: Diagnosis not present

## 2021-03-16 DIAGNOSIS — M25511 Pain in right shoulder: Secondary | ICD-10-CM | POA: Diagnosis not present

## 2021-03-19 DIAGNOSIS — M25511 Pain in right shoulder: Secondary | ICD-10-CM | POA: Diagnosis not present

## 2021-03-23 DIAGNOSIS — M25511 Pain in right shoulder: Secondary | ICD-10-CM | POA: Diagnosis not present

## 2021-03-26 DIAGNOSIS — M25511 Pain in right shoulder: Secondary | ICD-10-CM | POA: Diagnosis not present

## 2021-04-02 DIAGNOSIS — M25511 Pain in right shoulder: Secondary | ICD-10-CM | POA: Diagnosis not present

## 2021-06-15 DIAGNOSIS — L570 Actinic keratosis: Secondary | ICD-10-CM | POA: Diagnosis not present

## 2021-06-15 DIAGNOSIS — Z85828 Personal history of other malignant neoplasm of skin: Secondary | ICD-10-CM | POA: Diagnosis not present

## 2021-06-15 DIAGNOSIS — D692 Other nonthrombocytopenic purpura: Secondary | ICD-10-CM | POA: Diagnosis not present

## 2021-06-15 DIAGNOSIS — L738 Other specified follicular disorders: Secondary | ICD-10-CM | POA: Diagnosis not present

## 2021-06-15 DIAGNOSIS — L821 Other seborrheic keratosis: Secondary | ICD-10-CM | POA: Diagnosis not present

## 2021-07-02 ENCOUNTER — Ambulatory Visit (INDEPENDENT_AMBULATORY_CARE_PROVIDER_SITE_OTHER): Payer: Medicare Other | Admitting: Legal Medicine

## 2021-07-02 ENCOUNTER — Other Ambulatory Visit: Payer: Self-pay

## 2021-07-02 ENCOUNTER — Encounter: Payer: Self-pay | Admitting: Legal Medicine

## 2021-07-02 VITALS — BP 118/70 | HR 60 | Temp 97.5°F | Resp 16 | Ht 73.0 in | Wt 205.0 lb

## 2021-07-02 DIAGNOSIS — H906 Mixed conductive and sensorineural hearing loss, bilateral: Secondary | ICD-10-CM | POA: Diagnosis not present

## 2021-07-02 DIAGNOSIS — K219 Gastro-esophageal reflux disease without esophagitis: Secondary | ICD-10-CM | POA: Diagnosis not present

## 2021-07-02 DIAGNOSIS — E782 Mixed hyperlipidemia: Secondary | ICD-10-CM

## 2021-07-02 NOTE — Progress Notes (Signed)
Established Patient Office Visit  Subjective:  Patient ID: Adrian Allen, male    DOB: [birth year]  Age: 70 y.o. MRN: [id number]  CC:  Chief Complaint  Patient presents with   Hyperlipidemia   Gastroesophageal Reflux    HPI Adrian Allen presents for hypertensioin  Patient presents for follow up of hypertension.  Patient tolerating none well with side effects.  Patient was diagnosed with hypertension 2010 so has been treated for hypertension for 10 years.Patient is working on maintaining diet and exercise regimen and follows up as directed. Complication include none.   Past Medical History:  Diagnosis Date   Acute gastric ulcer with hemorrhage 06/24/2020   Arthritis    right ankle   Gastroesophageal reflux disease 06/24/2020   Mixed conductive and sensorineural hearing loss, bilateral 06/24/2020   Mixed hyperlipidemia 06/24/2020    Past Surgical History:  Procedure Laterality Date   FRACTURE SURGERY     right ankle surgery in [place] Left 05/25/2020    Family History  Problem Relation Age of Onset   Stomach cancer Father    Heart Problems Brother     Social History   Socioeconomic History   Marital status: Married    Spouse name: Not on file   Number of children: 0   Years of education: Not on file   Highest education level: Not on file  Occupational History   Occupation: Retired  Tobacco Use   Smoking status: Former    Types: Cigarettes    Quit date: 2019    Years since quitting: 3.5   Smokeless tobacco: Never  Vaping Use   Vaping Use: Never used  Substance and Sexual Activity   Alcohol use: Not Currently   Drug use: No   Sexual activity: Yes    Partners: Female  Other Topics Concern   Not on file  Social History Narrative   Not on file   Social Determinants of Health   Financial Resource Strain: Not on file  Food Insecurity: Not on file  Transportation Needs: Not on file  Physical Activity: Not on file  Stress:  Not on file  Social Connections: Not on file  Intimate Partner Violence: Not on file    No outpatient medications prior to visit.   No facility-administered medications prior to visit.    No Known Allergies  ROS Review of Systems  Constitutional:  Negative for chills, fatigue and fever.  HENT:  Negative for congestion, ear pain and sore throat.   Respiratory:  Negative for cough and shortness of breath.   Cardiovascular:  Negative for chest pain.  Gastrointestinal:  Negative for abdominal pain, constipation, diarrhea, nausea and vomiting.  Endocrine: Negative for polydipsia, polyphagia and polyuria.  Genitourinary:  Negative for dysuria and frequency.  Musculoskeletal:  Negative for arthralgias and myalgias.  Skin: Negative.   Neurological:  Negative for dizziness and headaches.  Psychiatric/Behavioral:  Negative for dysphoric mood.        No dysphoria     Objective:    Physical Exam Vitals reviewed.  Constitutional:      Appearance: Normal appearance.  HENT:     Head: Normocephalic.     Right Ear: Tympanic membrane, ear canal and external ear normal.     Left Ear: Tympanic membrane, ear canal and external ear normal.     Nose: Nose normal.     Mouth/Throat:     Mouth: Mucous membranes are moist.     Pharynx: Oropharynx is  clear.  Eyes:     Extraocular Movements: Extraocular movements intact.     Conjunctiva/sclera: Conjunctivae normal.     Pupils: Pupils are equal, round, and reactive to light.  Cardiovascular:     Rate and Rhythm: Normal rate and regular rhythm.     Pulses: Normal pulses.     Heart sounds: Normal heart sounds. No murmur heard.   No gallop.  Pulmonary:     Effort: Pulmonary effort is normal. No respiratory distress.     Breath sounds: Normal breath sounds. No wheezing.  Abdominal:     General: Abdomen is flat. Bowel sounds are normal. There is no distension.     Palpations: Abdomen is soft.     Tenderness: There is no abdominal tenderness.   Musculoskeletal:        General: Normal range of motion.     Cervical back: Normal range of motion.  Skin:    General: Skin is warm.     Capillary Refill: Capillary refill takes less than 2 seconds.  Neurological:     General: No focal deficit present.     Mental Status: He is alert. Mental status is at baseline.    BP 118/70   Pulse 60   Temp (!) 97.5 F (36.4 C)   Resp 16   Ht 6\' 1"  (1.854 m)   Wt 205 lb (93 kg)   SpO2 93%   BMI 27.05 kg/m  Wt Readings from Last 3 Encounters:  07/02/21 205 lb (93 kg)  12/25/20 211 lb (95.7 kg)  10/03/20 195 lb 3.2 oz (88.5 kg)     Health Maintenance Due  Topic Date Due   TETANUS/TDAP  Never done   Zoster Vaccines- Shingrix (1 of 2) Never done   PNA vac Low Risk Adult (2 of 2 - PCV13) 06/19/2020   COVID-19 Vaccine (4 - Booster for Moderna series) 03/04/2021    There are no preventive care reminders to display for this patient.  Lab Results  Component Value Date   TSH 1.980 06/24/2020   Lab Results  Component Value Date   WBC 7.2 12/25/2020   HGB 14.1 12/25/2020   HCT 41.0 12/25/2020   MCV 94 12/25/2020   PLT 159 12/25/2020   Lab Results  Component Value Date   NA 142 12/25/2020   K 4.6 12/25/2020   CO2 24 12/25/2020   GLUCOSE 97 12/25/2020   BUN 18 12/25/2020   CREATININE 1.34 (H) 12/25/2020   BILITOT 0.5 12/25/2020   ALKPHOS 165 (H) 12/25/2020   AST 19 12/25/2020   ALT 13 12/25/2020   PROT 6.6 12/25/2020   ALBUMIN 4.0 12/25/2020   CALCIUM 9.5 12/25/2020   Lab Results  Component Value Date   CHOL 180 12/25/2020   Lab Results  Component Value Date   HDL 47 12/25/2020   Lab Results  Component Value Date   LDLCALC 110 (H) 12/25/2020   Lab Results  Component Value Date   TRIG 126 12/25/2020   Lab Results  Component Value Date   CHOLHDL 3.8 12/25/2020   No results found for: HGBA1C    Assessment & Plan:   Problem List Items Addressed This Visit       Digestive   Gastroesophageal reflux  disease   Relevant Orders   Lipid panel   CBC with Differential   Comprehensive metabolic panel Plan of care was formulated today.  he is doing well.  A plan of care was formulated using patient exam, tests and other  sources to optimize care using evidence based information.  Recommend no smoking, no eating after supper, avoid fatty foods, elevate Head of bed, avoid tight fitting clothing.  Continue on OTC.      Other   Mixed hyperlipidemia - Primary   Relevant Orders   Lipid panel   CBC with Differential   Comprehensive metabolic panel AN INDIVIDUAL CARE PLAN for hyperlipidemia/ cholesterol was established and reinforced today.  The patient's status was assessed using clinical findings on exam, lab and other diagnostic tests. The patient's disease status was assessed based on evidence-based guidelines and found to be fair controlled. MEDICATIONS were reviewed. SELF MANAGEMENT GOALS have been discussed and patient's success at attaining the goal of low cholesterol was assessed. RECOMMENDATION given include regular exercise 3 days a week and low cholesterol/low fat diet. CLINICAL SUMMARY including written plan to identify barriers unique to the patient due to social or economic  reasons was discussed.        Follow-up: Return in about 6 months (around 01/02/2022) for fasting.    Reinaldo Meeker, MD

## 2021-07-03 LAB — CBC WITH DIFFERENTIAL/PLATELET
Basophils Absolute: 0.1 10*3/uL (ref 0.0–0.2)
Basos: 1 %
EOS (ABSOLUTE): 0.2 10*3/uL (ref 0.0–0.4)
Eos: 2 %
Hematocrit: 42.6 % (ref 37.5–51.0)
Hemoglobin: 14.3 g/dL (ref 13.0–17.7)
Immature Grans (Abs): 0.1 10*3/uL (ref 0.0–0.1)
Immature Granulocytes: 1 %
Lymphocytes Absolute: 1.9 10*3/uL (ref 0.7–3.1)
Lymphs: 24 %
MCH: 32.3 pg (ref 26.6–33.0)
MCHC: 33.6 g/dL (ref 31.5–35.7)
MCV: 96 fL (ref 79–97)
Monocytes Absolute: 0.8 10*3/uL (ref 0.1–0.9)
Monocytes: 9 %
Neutrophils Absolute: 5.1 10*3/uL (ref 1.4–7.0)
Neutrophils: 63 %
Platelets: 146 10*3/uL — ABNORMAL LOW (ref 150–450)
RBC: 4.43 x10E6/uL (ref 4.14–5.80)
RDW: 11.7 % (ref 11.6–15.4)
WBC: 8.1 10*3/uL (ref 3.4–10.8)

## 2021-07-03 LAB — COMPREHENSIVE METABOLIC PANEL
ALT: 18 IU/L (ref 0–44)
AST: 16 IU/L (ref 0–40)
Albumin/Globulin Ratio: 1.6 (ref 1.2–2.2)
Albumin: 4.1 g/dL (ref 3.8–4.8)
Alkaline Phosphatase: 108 IU/L (ref 44–121)
BUN/Creatinine Ratio: 12 (ref 10–24)
BUN: 14 mg/dL (ref 8–27)
Bilirubin Total: 0.5 mg/dL (ref 0.0–1.2)
CO2: 23 mmol/L (ref 20–29)
Calcium: 9.5 mg/dL (ref 8.6–10.2)
Chloride: 104 mmol/L (ref 96–106)
Creatinine, Ser: 1.18 mg/dL (ref 0.76–1.27)
Globulin, Total: 2.5 g/dL (ref 1.5–4.5)
Glucose: 101 mg/dL — ABNORMAL HIGH (ref 65–99)
Potassium: 4.8 mmol/L (ref 3.5–5.2)
Sodium: 140 mmol/L (ref 134–144)
Total Protein: 6.6 g/dL (ref 6.0–8.5)
eGFR: 67 mL/min/{1.73_m2} (ref >59)

## 2021-07-03 LAB — LIPID PANEL
Chol/HDL Ratio: 3.8 ratio (ref 0.0–5.0)
Cholesterol, Total: 165 mg/dL (ref 100–199)
HDL: 44 mg/dL (ref >39)
LDL Chol Calc (NIH): 104 mg/dL — ABNORMAL HIGH (ref 0–99)
Triglycerides: 90 mg/dL (ref 0–149)
VLDL Cholesterol Cal: 17 mg/dL (ref 5–40)

## 2021-07-03 LAB — CARDIOVASCULAR RISK ASSESSMENT

## 2021-07-03 NOTE — Progress Notes (Signed)
LDL cholesterol 104 needs a statin, CBC normal, platelets slightly low, glucose 101, kidney and liver tests normal,  lp

## 2021-07-06 NOTE — Progress Notes (Signed)
Give DASH diet lp

## 2021-08-19 ENCOUNTER — Telehealth: Payer: Self-pay

## 2021-08-19 NOTE — Telephone Encounter (Signed)
Called pt could not leave VM.  KP

## 2021-09-07 ENCOUNTER — Ambulatory Visit (INDEPENDENT_AMBULATORY_CARE_PROVIDER_SITE_OTHER): Payer: Medicare Other

## 2021-09-07 ENCOUNTER — Encounter: Payer: Self-pay | Admitting: Legal Medicine

## 2021-09-07 DIAGNOSIS — Z23 Encounter for immunization: Secondary | ICD-10-CM

## 2021-09-09 ENCOUNTER — Other Ambulatory Visit: Payer: Self-pay

## 2021-09-09 ENCOUNTER — Ambulatory Visit (INDEPENDENT_AMBULATORY_CARE_PROVIDER_SITE_OTHER): Payer: Medicare Other | Admitting: Legal Medicine

## 2021-09-09 ENCOUNTER — Encounter: Payer: Self-pay | Admitting: Legal Medicine

## 2021-09-09 VITALS — BP 110/62 | HR 80 | Temp 97.5°F | Ht 75.0 in | Wt 197.0 lb

## 2021-09-09 DIAGNOSIS — Z122 Encounter for screening for malignant neoplasm of respiratory organs: Secondary | ICD-10-CM | POA: Diagnosis not present

## 2021-09-09 DIAGNOSIS — R351 Nocturia: Secondary | ICD-10-CM | POA: Diagnosis not present

## 2021-09-09 DIAGNOSIS — N3001 Acute cystitis with hematuria: Secondary | ICD-10-CM | POA: Diagnosis not present

## 2021-09-09 DIAGNOSIS — N401 Enlarged prostate with lower urinary tract symptoms: Secondary | ICD-10-CM | POA: Diagnosis not present

## 2021-09-09 DIAGNOSIS — N400 Enlarged prostate without lower urinary tract symptoms: Secondary | ICD-10-CM | POA: Insufficient documentation

## 2021-09-09 DIAGNOSIS — N069 Isolated proteinuria with unspecified morphologic lesion: Secondary | ICD-10-CM

## 2021-09-09 LAB — POCT URINALYSIS DIP (CLINITEK)
Bilirubin, UA: NEGATIVE
Glucose, UA: NEGATIVE mg/dL
Ketones, POC UA: NEGATIVE mg/dL
Nitrite, UA: POSITIVE — AB
Spec Grav, UA: 1.025 (ref 1.010–1.025)
Urobilinogen, UA: 0.2 E.U./dL
pH, UA: 6 (ref 5.0–8.0)

## 2021-09-09 MED ORDER — CIPROFLOXACIN HCL 500 MG PO TABS: 500.0000 mg | ORAL_TABLET | Freq: Two times a day (BID) | ORAL | 0 refills | Status: AC

## 2021-09-09 NOTE — Progress Notes (Signed)
Established Patient Office Visit  Subjective:  Patient ID: Adrian Allen, male    DOB: January 21, 1951  Age: 70 y.o. MRN: [id number]  CC:  Chief Complaint  Patient presents with   Urinary Tract Infection    All started a month ago, no changes for better or worse. No burning, itching.    HPI Adrian Allen presents for cloudy urine for 4 weeks.  No dysuria, he gets ammonia smell. No history of UTIs. He had trauma kidneys with fall one year ago.  Smoking 48 x 1ppd- 48 pack years, needs low dose ct  Past Medical History:  Diagnosis Date   Acute gastric ulcer with hemorrhage 06/24/2020   Arthritis    right ankle   Gastroesophageal reflux disease 06/24/2020   Mixed conductive and sensorineural hearing loss, bilateral 06/24/2020   Mixed hyperlipidemia 06/24/2020    Past Surgical History:  Procedure Laterality Date   FRACTURE SURGERY     right ankle surgery in [place] Left 05/25/2020    Family History  Problem Relation Age of Onset   Stomach cancer Father    Heart Problems Brother     Social History   Socioeconomic History   Marital status: Married    Spouse name: Not on file   Number of children: 0   Years of education: Not on file   Highest education level: Not on file  Occupational History   Occupation: Retired  Tobacco Use   Smoking status: Former    Types: Cigarettes    Quit date: 2019    Years since quitting: 3.7   Smokeless tobacco: Never  Vaping Use   Vaping Use: Never used  Substance and Sexual Activity   Alcohol use: Not Currently   Drug use: No   Sexual activity: Yes    Partners: Female  Other Topics Concern   Not on file  Social History Narrative   Not on file   Social Determinants of Health   Financial Resource Strain: Not on file  Food Insecurity: Not on file  Transportation Needs: Not on file  Physical Activity: Not on file  Stress: Not on file  Social Connections: Not on file  Intimate Partner Violence: Not  on file    No outpatient medications prior to visit.   No facility-administered medications prior to visit.    No Known Allergies  ROS Review of Systems  Constitutional:  Negative for chills, fatigue and fever.  HENT:  Negative for congestion, ear pain and sore throat.   Respiratory:  Negative for cough and shortness of breath.   Cardiovascular:  Negative for chest pain.  Gastrointestinal:  Negative for abdominal pain, constipation, diarrhea, nausea and vomiting.  Endocrine: Negative for polydipsia, polyphagia and polyuria.  Genitourinary:  Negative for dysuria and frequency.  Musculoskeletal:  Negative for arthralgias and myalgias.  Neurological:  Negative for dizziness and headaches.  Psychiatric/Behavioral:  Negative for dysphoric mood.        No dysphoria     Objective:    Physical Exam Vitals reviewed.  Constitutional:      General: He is not in acute distress.    Appearance: Normal appearance.  HENT:     Right Ear: Tympanic membrane, ear canal and external ear normal.     Left Ear: Tympanic membrane, ear canal and external ear normal.     Mouth/Throat:     Mouth: Mucous membranes are moist.  Eyes:     Extraocular Movements: Extraocular movements intact.  Conjunctiva/sclera: Conjunctivae normal.     Pupils: Pupils are equal, round, and reactive to light.  Cardiovascular:     Rate and Rhythm: Normal rate and regular rhythm.     Pulses: Normal pulses.     Heart sounds: Normal heart sounds. No murmur heard.   No gallop.  Pulmonary:     Effort: Pulmonary effort is normal. No respiratory distress.     Breath sounds: No wheezing.  Abdominal:     General: Abdomen is flat. Bowel sounds are normal. There is no distension.     Palpations: Abdomen is soft.     Tenderness: There is no abdominal tenderness.  Genitourinary:    Prostate: Normal.  Musculoskeletal:        General: Normal range of motion.     Cervical back: Normal range of motion.  Skin:    General:  Skin is warm.     Capillary Refill: Capillary refill takes less than 2 seconds.  Neurological:     General: No focal deficit present.     Mental Status: He is alert and oriented to person, place, and time. Mental status is at baseline.  Psychiatric:        Mood and Affect: Mood normal.    BP 110/62   Pulse 80   Temp (!) 97.5 F (36.4 C)   Ht 6' 3"  (1.905 m)   Wt 197 lb (89.4 kg)   SpO2 95%   BMI 24.62 kg/m  Wt Readings from Last 3 Encounters:  09/09/21 197 lb (89.4 kg)  07/02/21 205 lb (93 kg)  12/25/20 211 lb (95.7 kg)     Health Maintenance Due  Topic Date Due   TETANUS/TDAP  Never done   Zoster Vaccines- Shingrix (1 of 2) Never done   COVID-19 Vaccine (4 - Booster for Moderna series) 03/04/2021    There are no preventive care reminders to display for this patient.  Lab Results  Component Value Date   TSH 1.980 06/24/2020   Lab Results  Component Value Date   WBC 8.1 07/02/2021   HGB 14.3 07/02/2021   HCT 42.6 07/02/2021   MCV 96 07/02/2021   PLT 146 (L) 07/02/2021   Lab Results  Component Value Date   NA 140 07/02/2021   K 4.8 07/02/2021   CO2 23 07/02/2021   GLUCOSE 101 (H) 07/02/2021   BUN 14 07/02/2021   CREATININE 1.18 07/02/2021   BILITOT 0.5 07/02/2021   ALKPHOS 108 07/02/2021   AST 16 07/02/2021   ALT 18 07/02/2021   PROT 6.6 07/02/2021   ALBUMIN 4.1 07/02/2021   CALCIUM 9.5 07/02/2021   EGFR 67 07/02/2021   Lab Results  Component Value Date   CHOL 165 07/02/2021   Lab Results  Component Value Date   HDL 44 07/02/2021   Lab Results  Component Value Date   LDLCALC 104 (H) 07/02/2021   Lab Results  Component Value Date   TRIG 90 07/02/2021   Lab Results  Component Value Date   CHOLHDL 3.8 07/02/2021   No results found for: HGBA1C    Assessment & Plan:   Problem List Items Addressed This Visit       Genitourinary   BPH (benign prostatic hyperplasia)   Relevant Orders   POCT URINALYSIS DIP (CLINITEK) (Completed)    PSA AN INDIVIDUAL CARE PLAN BPH was established and reinforced today.  The patient's status was assessed using clinical findings on exam, labs, and other diagnostic testing. Patient's success at meeting treatment goals  based on disease specific evidence-bassed guidelines and found to be in good control. RECOMMENDATIONS include maintaining present medicines and treatment.    Other Visit Diagnoses     Isolated proteinuria with morphologic lesion    -  Primary   Relevant Orders   US Abdomen Complete Ultrasound kidneys    Screening for lung cancer       Relevant Orders   CT CHEST LUNG CA SCREEN LOW DOSE W/O CM   Acute cystitis with hematuria       Relevant Medications   ciprofloxacin (CIPRO) 500 MG tablet   Other Relevant Orders   Urine Culture   Comprehensive metabolic panel Patient has acute cystitis, treat with cipro for 10 days       Meds ordered this encounter  Medications   ciprofloxacin (CIPRO) 500 MG tablet    Sig: Take 1 tablet (500 mg total) by mouth 2 (two) times daily for 10 days.    Dispense:  20 tablet    Refill:  0    Follow-up: Return in about 2 weeks (around 09/23/2021) for uti.    Reinaldo Meeker, MD

## 2021-09-10 ENCOUNTER — Ambulatory Visit
Admission: RE | Admit: 2021-09-10 | Discharge: 2021-09-10 | Disposition: A | Discharge status: Home / Self Care | Payer: Medicare Other | Source: Ambulatory Visit | Attending: Legal Medicine | Admitting: Legal Medicine

## 2021-09-10 DIAGNOSIS — R809 Proteinuria, unspecified: Secondary | ICD-10-CM | POA: Diagnosis not present

## 2021-09-10 LAB — PSA: Prostate Specific Ag, Serum: 0.8 ng/mL (ref 0.0–4.0)

## 2021-09-10 LAB — COMPREHENSIVE METABOLIC PANEL
ALT: 23 IU/L (ref 0–44)
AST: 30 IU/L (ref 0–40)
Albumin/Globulin Ratio: 1.5 (ref 1.2–2.2)
Albumin: 4.1 g/dL (ref 3.8–4.8)
Alkaline Phosphatase: 96 IU/L (ref 44–121)
BUN/Creatinine Ratio: 9 — ABNORMAL LOW (ref 10–24)
BUN: 13 mg/dL (ref 8–27)
Bilirubin Total: 0.8 mg/dL (ref 0.0–1.2)
CO2: 23 mmol/L (ref 20–29)
Calcium: 9 mg/dL (ref 8.6–10.2)
Chloride: 102 mmol/L (ref 96–106)
Creatinine, Ser: 1.42 mg/dL — ABNORMAL HIGH (ref 0.76–1.27)
Globulin, Total: 2.8 g/dL (ref 1.5–4.5)
Glucose: 86 mg/dL (ref 65–99)
Potassium: 4.2 mmol/L (ref 3.5–5.2)
Sodium: 141 mmol/L (ref 134–144)
Total Protein: 6.9 g/dL (ref 6.0–8.5)
eGFR: 54 mL/min/{1.73_m2} — ABNORMAL LOW (ref >59)

## 2021-09-10 NOTE — Progress Notes (Signed)
Psa 0.8 normal, kidneys stage 3a mild, we will follow , liver tests normal,

## 2021-09-11 NOTE — Progress Notes (Signed)
Both kidneys are normal size and texture- no evidence for any disease lp

## 2021-09-14 LAB — URINE CULTURE

## 2021-09-15 ENCOUNTER — Other Ambulatory Visit: Payer: Self-pay

## 2021-09-15 ENCOUNTER — Ambulatory Visit
Admission: RE | Admit: 2021-09-15 | Discharge: 2021-09-15 | Disposition: A | Discharge status: Home / Self Care | Payer: Medicare Other | Source: Ambulatory Visit | Attending: Legal Medicine | Admitting: Legal Medicine

## 2021-09-15 DIAGNOSIS — Z87891 Personal history of nicotine dependence: Secondary | ICD-10-CM | POA: Diagnosis not present

## 2021-09-23 ENCOUNTER — Ambulatory Visit: Payer: Medicare Other | Admitting: Legal Medicine

## 2021-10-07 DIAGNOSIS — C44329 Squamous cell carcinoma of skin of other parts of face: Secondary | ICD-10-CM | POA: Diagnosis not present

## 2021-10-07 DIAGNOSIS — C44320 Squamous cell carcinoma of skin of unspecified parts of face: Secondary | ICD-10-CM | POA: Diagnosis not present

## 2021-10-14 DIAGNOSIS — C44329 Squamous cell carcinoma of skin of other parts of face: Secondary | ICD-10-CM | POA: Diagnosis not present

## 2021-10-16 ENCOUNTER — Encounter (HOSPITAL_COMMUNITY): Payer: Self-pay | Admitting: Radiology

## 2021-10-21 ENCOUNTER — Other Ambulatory Visit: Payer: Self-pay

## 2021-10-21 ENCOUNTER — Ambulatory Visit (INDEPENDENT_AMBULATORY_CARE_PROVIDER_SITE_OTHER): Payer: Medicare Other | Admitting: Legal Medicine

## 2021-10-21 ENCOUNTER — Encounter: Payer: Self-pay | Admitting: Legal Medicine

## 2021-10-21 VITALS — BP 110/70 | HR 65 | Temp 97.8°F | Resp 16 | Ht 75.0 in | Wt 193.0 lb

## 2021-10-21 DIAGNOSIS — N3001 Acute cystitis with hematuria: Secondary | ICD-10-CM

## 2021-10-21 DIAGNOSIS — M25579 Pain in unspecified ankle and joints of unspecified foot: Secondary | ICD-10-CM | POA: Insufficient documentation

## 2021-10-21 NOTE — Progress Notes (Signed)
Acute Office Visit  Subjective:    Patient ID: Adrian Allen, male    DOB: Apr 21, 1951, 70 y.o.   MRN: [id number]  Chief Complaint  Patient presents with   Urinary Tract Infection    HPI: Patient is in today for recheck urine. He took doxycycline for UTI. No further symptoms in 48 hours.  No further problems. Ultrsaound kidneys normal.  Past Medical History:  Diagnosis Date   Acute gastric ulcer with hemorrhage 06/24/2020   Arthritis    right ankle   Closed fracture of upper end of left fibula 09/23/2020   Closed nondisplaced fracture of body of right scapula 09/23/2020   Closed nondisplaced fracture of third metatarsal bone of left foot 09/23/2020   Gastroesophageal reflux disease 06/24/2020   Mixed conductive and sensorineural hearing loss, bilateral 06/24/2020   Mixed hyperlipidemia 06/24/2020    Past Surgical History:  Procedure Laterality Date   FRACTURE SURGERY     right ankle surgery in [place] Left 05/25/2020    Family History  Problem Relation Age of Onset   Stomach cancer Father    Heart Problems Brother     Social History   Socioeconomic History   Marital status: Married    Spouse name: Not on file   Number of children: 0   Years of education: Not on file   Highest education level: Not on file  Occupational History   Occupation: Retired  Tobacco Use   Smoking status: Former    Packs/day: 1.00    Types: Cigarettes    Quit date: 2019    Years since quitting: 3.8   Smokeless tobacco: Never  Vaping Use   Vaping Use: Never used  Substance and Sexual Activity   Alcohol use: Not Currently   Drug use: No   Sexual activity: Yes    Partners: Female  Other Topics Concern   Not on file  Social History Narrative   Not on file   Social Determinants of Health   Financial Resource Strain: Not on file  Food Insecurity: Not on file  Transportation Needs: Not on file  Physical Activity: Not on file  Stress: Not on file  Social  Connections: Not on file  Intimate Partner Violence: Not on file    Outpatient Medications Prior to Visit  Medication Sig Dispense Refill   doxycycline (VIBRAMYCIN) 100 MG capsule Take 100 mg by mouth 2 (two) times daily.     No facility-administered medications prior to visit.    No Known Allergies  Review of Systems  Constitutional:  Negative for chills, fatigue and unexpected weight change.  HENT:  Negative for congestion, ear pain, sinus pain and sore throat.   Respiratory:  Negative for cough and shortness of breath.   Cardiovascular:  Negative for chest pain and palpitations.  Gastrointestinal:  Negative for abdominal pain, blood in stool, constipation, diarrhea, nausea and vomiting.  Endocrine: Negative for polydipsia.  Genitourinary:  Negative for dysuria.  Musculoskeletal:  Negative for back pain.  Skin:  Negative for rash.  Neurological:  Negative for headaches.      Objective:    Physical Exam Vitals reviewed.  Constitutional:      General: He is not in acute distress.    Appearance: Normal appearance.  HENT:     Head: Normocephalic and atraumatic.     Right Ear: Tympanic membrane, ear canal and external ear normal.     Left Ear: Tympanic membrane, ear canal and external ear normal.  Mouth/Throat:     Mouth: Mucous membranes are moist.     Pharynx: Oropharynx is clear.  Eyes:     Extraocular Movements: Extraocular movements intact.     Conjunctiva/sclera: Conjunctivae normal.     Pupils: Pupils are equal, round, and reactive to light.  Cardiovascular:     Rate and Rhythm: Normal rate and regular rhythm.     Pulses: Normal pulses.     Heart sounds: Normal heart sounds. No murmur heard.   No gallop.  Pulmonary:     Effort: Pulmonary effort is normal. No respiratory distress.     Breath sounds: Normal breath sounds. No wheezing.  Abdominal:     General: Abdomen is flat. Bowel sounds are normal. There is no distension.     Palpations: Abdomen is soft.      Tenderness: There is no abdominal tenderness.  Musculoskeletal:        General: Normal range of motion.     Cervical back: Normal range of motion.  Skin:    General: Skin is warm and dry.     Capillary Refill: Capillary refill takes less than 2 seconds.  Neurological:     General: No focal deficit present.     Mental Status: He is alert and oriented to person, place, and time. Mental status is at baseline.  Psychiatric:        Mood and Affect: Mood normal.        Thought Content: Thought content normal.    BP 110/70   Pulse 65   Temp 97.8 F (36.6 C)   Resp 16   Ht 6' 3"  (1.905 m)   Wt 193 lb (87.5 kg)   SpO2 98%   BMI 24.12 kg/m  Wt Readings from Last 3 Encounters:  10/21/21 193 lb (87.5 kg)  09/09/21 197 lb (89.4 kg)  07/02/21 205 lb (93 kg)    Health Maintenance Due  Topic Date Due   TETANUS/TDAP  Never done   Zoster Vaccines- Shingrix (1 of 2) Never done   Pneumonia Vaccine 77+ Years old (2 - PCV) 06/19/2020   COVID-19 Vaccine (3 - Booster for Moderna series) 12/30/2020    There are no preventive care reminders to display for this patient.   Lab Results  Component Value Date   TSH 1.980 06/24/2020   Lab Results  Component Value Date   WBC 8.1 07/02/2021   HGB 14.3 07/02/2021   HCT 42.6 07/02/2021   MCV 96 07/02/2021   PLT 146 (L) 07/02/2021   Lab Results  Component Value Date   NA 141 09/09/2021   K 4.2 09/09/2021   CO2 23 09/09/2021   GLUCOSE 86 09/09/2021   BUN 13 09/09/2021   CREATININE 1.42 (H) 09/09/2021   BILITOT 0.8 09/09/2021   ALKPHOS 96 09/09/2021   AST 30 09/09/2021   ALT 23 09/09/2021   PROT 6.9 09/09/2021   ALBUMIN 4.1 09/09/2021   CALCIUM 9.0 09/09/2021   EGFR 54 (L) 09/09/2021   Lab Results  Component Value Date   CHOL 165 07/02/2021   Lab Results  Component Value Date   HDL 44 07/02/2021   Lab Results  Component Value Date   LDLCALC 104 (H) 07/02/2021   Lab Results  Component Value Date   TRIG 90 07/02/2021    Lab Results  Component Value Date   CHOLHDL 3.8 07/02/2021   No results found for: HGBA1C     Assessment & Plan:   Problem List Items Addressed This Visit  None Visit Diagnoses     Acute cystitis with hematuria    -  Primary   Relevant Orders   POCT URINALYSIS DIP (CLINITEK)  Urine infection ws resolved           Follow-up: Return if symptoms worsen or fail to improve.  An After Visit Summary was printed and given to the patient.  Reinaldo Meeker, MD [facility] [phone]

## 2021-10-26 ENCOUNTER — Telehealth: Payer: Self-pay

## 2021-10-26 NOTE — Telephone Encounter (Signed)
Patient left voicemail about questions for a test he had done on sept 28. He had a letter from [facility] about his results, Called patient back was not able to leave voicemail due to voicemail full.

## 2021-10-27 ENCOUNTER — Telehealth: Payer: Self-pay

## 2021-10-27 NOTE — Telephone Encounter (Signed)
Ct chest was normal, no cancer lp

## 2021-10-27 NOTE — Telephone Encounter (Signed)
Patient was informed of the results of CT of the chest.

## 2021-10-27 NOTE — Telephone Encounter (Signed)
Patient called stated he got a letter from [facility] about a finding on a CT that provider order. Patient wanted to know what was found. CT is in chart and was done by [facility].

## 2021-12-20 HISTORY — PX: KNEE ARTHROSCOPY: SHX127

## 2021-12-22 DIAGNOSIS — L570 Actinic keratosis: Secondary | ICD-10-CM | POA: Diagnosis not present

## 2021-12-22 DIAGNOSIS — C44629 Squamous cell carcinoma of skin of left upper limb, including shoulder: Secondary | ICD-10-CM | POA: Diagnosis not present

## 2022-01-06 NOTE — Progress Notes (Addendum)
Subjective:  Patient ID: Adrian Allen, male    DOB: Apr 03, 1951  Age: 71 y.o. MRN: [id number]  Chief Complaint  Patient presents with   Hyperlipidemia   Benign Prostatic Hypertrophy   Gastroesophageal Reflux    HPI: chronic vist  Patient presents with hyperlipidemia.  Compliance with treatment has been good; patient takes medicines as directed, maintains low cholesterol diet, follows up as directed, and maintains exercise regimen.  Patient is using diet without problems.   Patient has bph but on no medicines  GERD is doing well with OTC medicines  2 skin cancers removed  Coronary calcification on CT- wants cardiology consult   No current outpatient medications on file prior to visit.   No current facility-administered medications on file prior to visit.   Past Medical History:  Diagnosis Date   Acute gastric ulcer with hemorrhage 06/24/2020   Arthritis    right ankle   Closed fracture of upper end of left fibula 09/23/2020   Closed nondisplaced fracture of body of right scapula 09/23/2020   Closed nondisplaced fracture of third metatarsal bone of left foot 09/23/2020   Gastroesophageal reflux disease 06/24/2020   Mixed conductive and sensorineural hearing loss, bilateral 06/24/2020   Mixed hyperlipidemia 06/24/2020   Past Surgical History:  Procedure Laterality Date   FRACTURE SURGERY     right ankle surgery in [place] Left 05/25/2020    Family History  Problem Relation Age of Onset   Stomach cancer Father    Heart Problems Brother    Social History   Socioeconomic History   Marital status: Married    Spouse name: Not on file   Number of children: 0   Years of education: Not on file   Highest education level: Not on file  Occupational History   Occupation: Retired  Tobacco Use   Smoking status: Former    Packs/day: 1.00    Types: Cigarettes    Quit date: 2019    Years since quitting: 4.0   Smokeless tobacco: Never  Vaping Use    Vaping Use: Never used  Substance and Sexual Activity   Alcohol use: Not Currently   Drug use: No   Sexual activity: Yes    Partners: Female  Other Topics Concern   Not on file  Social History Narrative   Not on file   Social Determinants of Health   Financial Resource Strain: Not on file  Food Insecurity: Not on file  Transportation Needs: Not on file  Physical Activity: Not on file  Stress: Not on file  Social Connections: Not on file    Review of Systems  Constitutional:  Negative for activity change and appetite change.  HENT:  Negative for congestion.   Eyes:  Negative for visual disturbance.  Respiratory:  Negative for chest tightness and shortness of breath.   Cardiovascular:  Negative for chest pain, palpitations and leg swelling.  Gastrointestinal:  Negative for abdominal distention and abdominal pain.  Endocrine: Negative.   Genitourinary: Negative.   Musculoskeletal:  Negative for arthralgias and back pain.  Neurological: Negative.   Psychiatric/Behavioral: Negative.      Objective:  BP 110/80    Pulse 90    Temp (!) 97.1 F (36.2 C)    Ht 6\' 1"  (1.854 m)    Wt 198 lb (89.8 kg)    SpO2 96%    BMI 26.12 kg/m   BP/Weight 01/07/2022 10/21/2021 05/16/7823  Systolic BP 235 361 443  Diastolic BP  80 70 62  Wt. (Lbs) 198 193 197  BMI 26.12 24.12 24.62    Physical Exam Vitals reviewed.  Constitutional:      General: He is not in acute distress.    Appearance: Normal appearance.  HENT:     Head: Normocephalic.     Right Ear: Tympanic membrane, ear canal and external ear normal.     Left Ear: Tympanic membrane, ear canal and external ear normal.     Nose: Nose normal.     Mouth/Throat:     Mouth: Mucous membranes are moist.     Pharynx: Oropharynx is clear.  Eyes:     Extraocular Movements: Extraocular movements intact.     Conjunctiva/sclera: Conjunctivae normal.     Pupils: Pupils are equal, round, and reactive to light.  Cardiovascular:     Rate and  Rhythm: Normal rate and regular rhythm.     Pulses: Normal pulses.     Heart sounds: No murmur heard.   No gallop.  Pulmonary:     Effort: Pulmonary effort is normal. No respiratory distress.     Breath sounds: No wheezing.  Abdominal:     General: Abdomen is flat. Bowel sounds are normal. There is no distension.     Palpations: Abdomen is soft.     Tenderness: There is no abdominal tenderness.  Musculoskeletal:        General: Normal range of motion.     Cervical back: Normal range of motion.     Right lower leg: No edema.     Left lower leg: No edema.  Skin:    General: Skin is warm.     Capillary Refill: Capillary refill takes less than 2 seconds.  Neurological:     General: No focal deficit present.     Mental Status: He is alert and oriented to person, place, and time. Mental status is at baseline.        Lab Results  Component Value Date   WBC 8.1 07/02/2021   HGB 14.3 07/02/2021   HCT 42.6 07/02/2021   PLT 146 (L) 07/02/2021   GLUCOSE 86 09/09/2021   CHOL 165 07/02/2021   TRIG 90 07/02/2021   HDL 44 07/02/2021   LDLCALC 104 (H) 07/02/2021   ALT 23 09/09/2021   AST 30 09/09/2021   NA 141 09/09/2021   K 4.2 09/09/2021   CL 102 09/09/2021   CREATININE 1.42 (H) 09/09/2021   BUN 13 09/09/2021   CO2 23 09/09/2021   TSH 1.980 06/24/2020      Assessment & Plan:   Problem List Items Addressed This Visit       Digestive   Gastroesophageal reflux disease - Primary   Relevant Orders   CBC with Differential/Platelet Plan of care was formulated today.  She is doing well.  A plan of care was formulated using patient exam, tests and other sources to optimize care using evidence based information.  Recommend no smoking, no eating after supper, avoid fatty foods, elevate Head of bed, avoid tight fitting clothing.  Continue on OTC.      Nervous and Auditory   Mixed conductive and sensorineural hearing loss, bilateral Patient doing well     Genitourinary   BPH  (benign prostatic hyperplasia)   Relevant Orders   PSA AN INDIVIDUAL CARE PLAN for BPH was established and reinforced today.  The patient's status was assessed using clinical findings on exam, labs, and other diagnostic testing. Patient's success at meeting treatment goals based on  disease specific evidence-bassed guidelines and found to be in good control. RECOMMENDATIONS include no medicines      Other   Mixed hyperlipidemia   Relevant Orders   Comprehensive metabolic panel   Lipid panel AN INDIVIDUAL CARE PLAN for hyperlipidemia/ cholesterol was established and reinforced today.  The patient's status was assessed using clinical findings on exam, lab and other diagnostic tests. The patient's disease status was assessed based on evidence-based guidelines and found to be fair controlled. MEDICATIONS were reviewed. SELF MANAGEMENT GOALS have been discussed and patient's success at attaining the goal of low cholesterol was assessed. RECOMMENDATION given include regular exercise 3 days a week and low cholesterol/low fat diet. CLINICAL SUMMARY including written plan to identify barriers unique to the patient due to social or economic  reasons was discussed.    Other Visit Diagnoses     Need for pneumococcal vaccine       Relevant Orders   Pneumococcal conjugate vaccine 20-valent (ITGPQDI-26) (Completed)     .  No orders of the defined types were placed in this encounter.   Orders Placed This Encounter  Procedures   Pneumococcal conjugate vaccine 20-valent (EBRAXEN-40)   Comprehensive metabolic panel   Lipid panel   CBC with Differential/Platelet   PSA     Follow-up: Return in about 6 months (around 07/07/2022).  An After Visit Summary was printed and given to the patient.  Reinaldo Meeker, MD [facility] [phone]

## 2022-01-07 ENCOUNTER — Ambulatory Visit (INDEPENDENT_AMBULATORY_CARE_PROVIDER_SITE_OTHER): Payer: Medicare Other | Admitting: Legal Medicine

## 2022-01-07 ENCOUNTER — Other Ambulatory Visit: Payer: Self-pay

## 2022-01-07 ENCOUNTER — Encounter: Payer: Self-pay | Admitting: Legal Medicine

## 2022-01-07 VITALS — BP 110/80 | HR 90 | Temp 97.1°F | Ht 73.0 in | Wt 198.0 lb

## 2022-01-07 DIAGNOSIS — I2510 Atherosclerotic heart disease of native coronary artery without angina pectoris: Secondary | ICD-10-CM

## 2022-01-07 DIAGNOSIS — I2584 Coronary atherosclerosis due to calcified coronary lesion: Secondary | ICD-10-CM

## 2022-01-07 DIAGNOSIS — R351 Nocturia: Secondary | ICD-10-CM

## 2022-01-07 DIAGNOSIS — K219 Gastro-esophageal reflux disease without esophagitis: Secondary | ICD-10-CM | POA: Diagnosis not present

## 2022-01-07 DIAGNOSIS — E782 Mixed hyperlipidemia: Secondary | ICD-10-CM | POA: Diagnosis not present

## 2022-01-07 DIAGNOSIS — Z23 Encounter for immunization: Secondary | ICD-10-CM

## 2022-01-07 DIAGNOSIS — H906 Mixed conductive and sensorineural hearing loss, bilateral: Secondary | ICD-10-CM | POA: Diagnosis not present

## 2022-01-07 DIAGNOSIS — N401 Enlarged prostate with lower urinary tract symptoms: Secondary | ICD-10-CM | POA: Diagnosis not present

## 2022-01-08 LAB — COMPREHENSIVE METABOLIC PANEL
ALT: 16 IU/L (ref 0–44)
AST: 24 IU/L (ref 0–40)
Albumin/Globulin Ratio: 1.7 (ref 1.2–2.2)
Albumin: 4.5 g/dL (ref 3.8–4.8)
Alkaline Phosphatase: 98 IU/L (ref 44–121)
BUN/Creatinine Ratio: 9 — ABNORMAL LOW (ref 10–24)
BUN: 12 mg/dL (ref 8–27)
Bilirubin Total: 1.2 mg/dL (ref 0.0–1.2)
CO2: 25 mmol/L (ref 20–29)
Calcium: 9.3 mg/dL (ref 8.6–10.2)
Chloride: 103 mmol/L (ref 96–106)
Creatinine, Ser: 1.32 mg/dL — ABNORMAL HIGH (ref 0.76–1.27)
Globulin, Total: 2.6 g/dL (ref 1.5–4.5)
Glucose: 110 mg/dL — ABNORMAL HIGH (ref 70–99)
Potassium: 4.9 mmol/L (ref 3.5–5.2)
Sodium: 141 mmol/L (ref 134–144)
Total Protein: 7.1 g/dL (ref 6.0–8.5)
eGFR: 59 mL/min/{1.73_m2} — ABNORMAL LOW (ref >59)

## 2022-01-08 LAB — CBC WITH DIFFERENTIAL/PLATELET
Basophils Absolute: 0.1 10*3/uL (ref 0.0–0.2)
Basos: 1 %
EOS (ABSOLUTE): 0.2 10*3/uL (ref 0.0–0.4)
Eos: 2 %
Hematocrit: 42.2 % (ref 37.5–51.0)
Hemoglobin: 14.5 g/dL (ref 13.0–17.7)
Immature Grans (Abs): 0 10*3/uL (ref 0.0–0.1)
Immature Granulocytes: 1 %
Lymphocytes Absolute: 2.2 10*3/uL (ref 0.7–3.1)
Lymphs: 28 %
MCH: 32.7 pg (ref 26.6–33.0)
MCHC: 34.4 g/dL (ref 31.5–35.7)
MCV: 95 fL (ref 79–97)
Monocytes Absolute: 0.8 10*3/uL (ref 0.1–0.9)
Monocytes: 11 %
Neutrophils Absolute: 4.5 10*3/uL (ref 1.4–7.0)
Neutrophils: 57 %
Platelets: 180 10*3/uL (ref 150–450)
RBC: 4.44 x10E6/uL (ref 4.14–5.80)
RDW: 11.9 % (ref 11.6–15.4)
WBC: 7.7 10*3/uL (ref 3.4–10.8)

## 2022-01-08 LAB — CARDIOVASCULAR RISK ASSESSMENT

## 2022-01-08 LAB — LIPID PANEL
Chol/HDL Ratio: 3.7 ratio (ref 0.0–5.0)
Cholesterol, Total: 160 mg/dL (ref 100–199)
HDL: 43 mg/dL (ref >39)
LDL Chol Calc (NIH): 98 mg/dL (ref 0–99)
Triglycerides: 102 mg/dL (ref 0–149)
VLDL Cholesterol Cal: 19 mg/dL (ref 5–40)

## 2022-01-08 LAB — PSA: Prostate Specific Ag, Serum: 0.6 ng/mL (ref 0.0–4.0)

## 2022-01-08 NOTE — Progress Notes (Signed)
Glucose 110, kidney tests show creatinine high 1.32, liver tests normal , cholesterol ldl 104, cbc normal, psa 0.6 normal, add A1c lp

## 2022-01-12 LAB — HGB A1C W/O EAG: Hgb A1c MFr Bld: 5.5 % (ref 4.8–5.6)

## 2022-01-12 LAB — SPECIMEN STATUS REPORT

## 2022-01-13 NOTE — Progress Notes (Signed)
A1c 5.5 normal lp

## 2022-01-18 ENCOUNTER — Encounter: Payer: Self-pay | Admitting: Podiatry

## 2022-01-18 ENCOUNTER — Ambulatory Visit: Payer: Medicare Other | Admitting: Podiatry

## 2022-01-18 DIAGNOSIS — M21621 Bunionette of right foot: Secondary | ICD-10-CM

## 2022-01-18 DIAGNOSIS — M2012 Hallux valgus (acquired), left foot: Secondary | ICD-10-CM

## 2022-01-18 DIAGNOSIS — M2011 Hallux valgus (acquired), right foot: Secondary | ICD-10-CM | POA: Diagnosis not present

## 2022-01-18 NOTE — Progress Notes (Signed)
Subjective:  Patient ID: Adrian Allen, male    DOB: May 03, 1951,  MRN: [id number]  Chief Complaint  Patient presents with   Callouses    I have a spot on the ball of the right foot and I would like for it to go away and hurts with walking     71 y.o. male presents with the above complaint. History confirmed with patient. States it felt better after being shaved down before but it is painful and interfering with walking now.  Objective:  Physical Exam: warm, good capillary refill, no trophic changes or ulcerative lesions, normal DP and PT pulses, and normal sensory examLarge Tailor's bunion right, HPK submet 5. Forefoot fat pad atrophy noted. Assessment:   1. Tailor's bunionette, right   2. Valgus deformity of both great toes    Plan:  Patient was evaluated and treated and all questions answered.  Tailor's bunion right with HPK -Debrided HPK -Discussed etiology and care of lesion -Could consider 5th met head excision but as this is infrequently bothersome would avoid.  No follow-ups on file.

## 2022-01-25 ENCOUNTER — Telehealth: Payer: Self-pay

## 2022-01-25 NOTE — Addendum Note (Signed)
Addended by: Reinaldo Meeker on: 01/25/2022 10:11 AM   Modules accepted: Orders

## 2022-01-25 NOTE — Telephone Encounter (Signed)
Requesting referral to Dr Bettina Gavia. Recently had CT, showed CORONARY ARTERY DISEASE and aortic atherosclerosis. Patient concerned and would like to see cardiology for these findings.   Please advise.   Adrian Allen, [place] 01/25/22 9:58 AM

## 2022-02-20 NOTE — Progress Notes (Signed)
?Cardiology Office Note:   ? ?Date:  02/22/2022  ? ?ID:  Adrian Allen, DOB 06-Sep-1951, MRN [id number] ? ?PCP:  Lillard Anes, MD  ?Cardiologist:  Shirlee More, MD  ? ?Referring MD: Lillard Anes,* ? ?ASSESSMENT:   ? ?1. Coronary artery calcification seen on CT scan   ?2. Mixed hyperlipidemia   ? ?PLAN:   ? ?In order of problems listed above: ? ?Adrian Allen has extensive diffuse coronary artery calcification on lung screening CT that would correlate with a very high calcium score although asymptomatic he is at risk for multivessel and severe high risk CAD should undergo further evaluation after discussion of functional testing versus CTA prefers a cardiac CTA likely schedule as an outpatient he is seen back in the office for decision making.  I did ask him to increase to start low-dose aspirin 81 mg daily he is hesitant to accept lipid-lowering treatment yet ? ?Next appointment 8 weeks ? ? ?Medication Adjustments/Labs and Tests Ordered: ?Current medicines are reviewed at length with the patient today.  Concerns regarding medicines are outlined above.  ?Orders Placed This Encounter  ?Procedures  ? CT CORONARY MORPH W/CTA COR W/SCORE W/CA W/CM &/OR WO/CM  ? Basic metabolic panel  ? EKG 12-Lead  ? ?Meds ordered this encounter  ?Medications  ? metoprolol tartrate (LOPRESSOR) 50 MG tablet  ?  Sig: Take 1 tablet (50 mg total) by mouth 2 (two) times daily.  ?  Dispense:  180 tablet  ?  Refill:  3  ?  ? ? ?Chief complaint, my CT scan was abnormal ? ? ? ?Adrian Allen is a 71 y.o. male with a history of hyperlipidemia who is being seen today for the evaluation of abnormal CT of the chest lung cancer screening 09/15/2021 with three-vessel left main coronary artery calcification At the request of Lillard Anes,*.  Chart review shows he has had no previous cardiology evaluation or diagnostic testing. ? ?Adrian Allen is seen in the office along with his wife who recently underwent a cardiac CTA in the  last few weeks. ?He is a previous smoker has a chronic cough and underwent a CT of the chest lung cancer screening protocol that showed extensive coronary artery calcification that would generally correlate with a very high coronary artery calcium score. ?Except for his cough he has had no cardiac or respiratory symptoms.  He has a good exercise tolerance and has no shortness of breath edema orthopnea chest pain palpitation or syncope. ?He has hyperlipidemia and is not on medical therapy. ?Lipid profile 01/07/2022 LDL 98 cholesterol 160 triglycerides 102 HDL 43 A1c 5.5% ?Past Medical History:  ?Diagnosis Date  ? Acute gastric ulcer with hemorrhage 06/24/2020  ? Arthritis   ? right ankle  ? Closed fracture of upper end of left fibula 09/23/2020  ? Closed nondisplaced fracture of body of right scapula 09/23/2020  ? Closed nondisplaced fracture of third metatarsal bone of left foot 09/23/2020  ? Gastroesophageal reflux disease 06/24/2020  ? Mixed conductive and sensorineural hearing loss, bilateral 06/24/2020  ? Mixed hyperlipidemia 06/24/2020  ? ? ?Past Surgical History:  ?Procedure Laterality Date  ? FRACTURE SURGERY    ? right ankle surgery in 1974  ? SKIN CANCER EXCISION Left 05/25/2020  ? ? ?Current Medications: ?Current Meds  ?Medication Sig  ? metoprolol tartrate (LOPRESSOR) 50 MG tablet Take 1 tablet (50 mg total) by mouth 2 (two) times daily.  ?  ? ?Allergies:   Patient has no known allergies.  ? ?  Social History  ? ?Socioeconomic History  ? Marital status: Married  ?  Spouse name: Not on file  ? Number of children: 0  ? Years of education: Not on file  ? Highest education level: Not on file  ?Occupational History  ? Occupation: Retired  ?Tobacco Use  ? Smoking status: Former  ?  Packs/day: 1.00  ?  Types: Cigarettes  ?  Quit date: 2019  ?  Years since quitting: 4.1  ? Smokeless tobacco: Never  ?Vaping Use  ? Vaping Use: Never used  ?Substance and Sexual Activity  ? Alcohol use: Not Currently  ? Drug use: No  ? Sexual  activity: Yes  ?  Partners: Female  ?Other Topics Concern  ? Not on file  ?Social History Narrative  ? Not on file  ? ?Social Determinants of Health  ? ?Financial Resource Strain: Not on file  ?Food Insecurity: Not on file  ?Transportation Needs: Not on file  ?Physical Activity: Not on file  ?Stress: Not on file  ?Social Connections: Not on file  ?  ? ?Family History: ?The patient's family history includes Heart Problems in his brother; Stomach cancer in his father. ? ?ROS:   ?ROS Please see the history of present illness.    ? All other systems reviewed and are negative. ? ?EKGs/Labs/Other Studies Reviewed:   ? ?The following studies were reviewed today: ? ? ?EKG:  EKG is  ordered today.  The ekg ordered today is personally reviewed and demonstrates sinus rhythm and is normal ? ?Recent Labs: ?01/07/2022: ALT 16; BUN 12; Creatinine, Ser 1.32; Hemoglobin 14.5; Platelets 180; Potassium 4.9; Sodium 141  ?Recent Lipid Panel ?   ?Component Value Date/Time  ? CHOL 160 01/07/2022 0837  ? TRIG 102 01/07/2022 0837  ? HDL 43 01/07/2022 0837  ? CHOLHDL 3.7 01/07/2022 0837  ? Ashley 98 01/07/2022 0837  ? ? ?Physical Exam:   ? ?VS:  BP 118/70 (BP Location: Left Arm)   Pulse 64   Ht '6\' 1"'$  (1.854 m)   Wt 191 lb 9.6 oz (86.9 kg)   SpO2 99%   BMI 25.28 kg/m?    ? ?Wt Readings from Last 3 Encounters:  ?02/22/22 191 lb 9.6 oz (86.9 kg)  ?01/07/22 198 lb (89.8 kg)  ?10/21/21 193 lb (87.5 kg)  ?  ? ?GEN: Appears his age looks healthy no xanthoma or xanthelasma well nourished, well developed in no acute distress ?HEENT: Normal ?NECK: No JVD; No carotid bruits ?LYMPHATICS: No lymphadenopathy ?CARDIAC: RRR, no murmurs, rubs, gallops ?RESPIRATORY:  Clear to auscultation without rales, wheezing or rhonchi  ?ABDOMEN: Soft, non-tender, non-distended ?MUSCULOSKELETAL:  No edema; No deformity  ?SKIN: Warm and dry ?NEUROLOGIC:  Alert and oriented x 3 ?PSYCHIATRIC:  Normal affect  ? ? ? ?Signed, ?Shirlee More, MD  ?02/22/2022 9:37 AM    ?[place] ?

## 2022-02-22 ENCOUNTER — Encounter: Payer: Self-pay | Admitting: Cardiology

## 2022-02-22 ENCOUNTER — Ambulatory Visit: Payer: Medicare Other | Admitting: Cardiology

## 2022-02-22 ENCOUNTER — Other Ambulatory Visit: Payer: Self-pay

## 2022-02-22 VITALS — BP 118/70 | HR 64 | Ht 73.0 in | Wt 191.6 lb

## 2022-02-22 DIAGNOSIS — I2510 Atherosclerotic heart disease of native coronary artery without angina pectoris: Secondary | ICD-10-CM

## 2022-02-22 DIAGNOSIS — E782 Mixed hyperlipidemia: Secondary | ICD-10-CM

## 2022-02-22 MED ORDER — METOPROLOL TARTRATE 50 MG PO TABS: 50.0000 mg | ORAL_TABLET | Freq: Two times a day (BID) | ORAL | 3 refills | Status: DC

## 2022-02-22 NOTE — Patient Instructions (Addendum)
Medication Instructions:  ?Your physician has recommended you make the following change in your medication: Pt had bleeding ulcer in 2018 so will not be taking the aspirin per Dr. Bettina Gavia ? ?*If you need a refill on your cardiac medications before your next appointment, please call your pharmacy* ? ? ?Lab Work: ?Your physician recommends that you return for lab work in: [place] ? ?If you have labs (blood work) drawn today and your tests are completely normal, you will receive your results only by: ?MyChart Message (if you have MyChart) OR ?A paper copy in the mail ?If you have any lab test that is abnormal or we need to change your treatment, we will call you to review the results. ? ? ?Testing/Procedures: ?Please arrive at the [facility] main entrance of [facility] at xx:xx AM (30-45 minutes prior to test start time) ? ?[facility] ?[address] ?[place], [place] [address] ?[phone] ? ?Proceed to the [facility] Radiology Department (First Floor). ? ?Please follow these instructions carefully (unless otherwise directed): ? ?Hold all erectile dysfunction medications at least 48 hours prior to test. ? ?On the Night Before the Test: ?Drink plenty of water. ?Do not consume any caffeinated/decaffeinated beverages or chocolate 12 hours prior to your test. ?Do not take any antihistamines 12 hours prior to your test. ?On the Day of the Test: ?Drink plenty of water. Do not drink any water within one hour of the test. ?Do not eat any food 4 hours prior to the test. ?You may take your regular medications prior to the test. ?IF NOT ON A BETA BLOCKER - Take 50 mg of lopressor (metoprolol) TWO  hours before the test. ?After the Test: ?Drink plenty of water. ?After receiving IV contrast, you may experience a mild flushed feeling. This is normal. ?On occasion, you may experience a mild rash up to 24 hours after the test. This is not dangerous. If this occurs, you can take Benadryl 25 mg and  increase your fluid intake. ?If you experience trouble breathing, this can be serious. If it is severe call 911 IMMEDIATELY. If it is mild, please call our office. ?If you take any of these medications: Glipizide/Metformin, Avandament, Glucavance, please do not take 48 hours after completing test. ? ? ? ?Follow-Up: ?At [facility], you and your health needs are our priority.  As part of our continuing mission to provide you with exceptional heart care, we have created designated Provider Care Teams.  These Care Teams include your primary Cardiologist (physician) and Advanced Practice Providers (APPs -  Physician Assistants and Nurse Practitioners) who all work together to provide you with the care you need, when you need it. ? ?We recommend signing up for the patient portal called "MyChart".  Sign up information is provided on this After Visit Summary.  MyChart is used to connect with patients for Virtual Visits (Telemedicine).  Patients are able to view lab/test results, encounter notes, upcoming appointments, etc.  Non-urgent messages can be sent to your provider as well.   ?To learn more about what you can do with MyChart, go to [email or web address].   ? ?Your next appointment:   ?8 week(s) ? ?The format for your next appointment:   ?In Person ? ?Provider:   ?Shirlee More, MD  ? ? ?Other Instructions ?  ?

## 2022-02-23 ENCOUNTER — Telehealth: Payer: Self-pay | Admitting: *Deleted

## 2022-02-23 LAB — BASIC METABOLIC PANEL
BUN/Creatinine Ratio: 12 (ref 10–24)
BUN: 15 mg/dL (ref 8–27)
CO2: 26 mmol/L (ref 20–29)
Calcium: 9.3 mg/dL (ref 8.6–10.2)
Chloride: 101 mmol/L (ref 96–106)
Creatinine, Ser: 1.24 mg/dL (ref 0.76–1.27)
Glucose: 91 mg/dL (ref 70–99)
Potassium: 4.5 mmol/L (ref 3.5–5.2)
Sodium: 141 mmol/L (ref 134–144)
eGFR: 63 mL/min/{1.73_m2} (ref >59)

## 2022-02-23 MED ORDER — METOPROLOL TARTRATE 50 MG PO TABS: ORAL_TABLET | ORAL | 0 refills | Status: DC

## 2022-02-23 NOTE — Telephone Encounter (Signed)
Cancelled Rx for Lopressor 50 mg to be taken bid sent in error and sent in Lopressor 50 mg for 1 time use 2 hours prior to Cardiac CT. Pt had not picked up the other rx yet.  ?

## 2022-03-01 ENCOUNTER — Ambulatory Visit: Payer: Medicare Other | Admitting: Cardiology

## 2022-03-08 ENCOUNTER — Telehealth (HOSPITAL_COMMUNITY): Payer: Self-pay | Admitting: *Deleted

## 2022-03-08 NOTE — Telephone Encounter (Signed)
Reaching out to patient to offer assistance regarding upcoming cardiac imaging study; pt verbalizes understanding of appt date/time, parking situation and where to check in, pre-test NPO status and medications ordered, and verified current allergies; name and call back number provided for further questions should they arise ? ?Gordy Clement RN Navigator Cardiac Imaging ?[facility] ?[phone] office ?[phone] cell ? ?Patient to take '25mg'$  metoprolol tartrate two hours prior to his cardiac CT scan.  He is aware to arrive at 9:30am for his 10am scan. ?

## 2022-03-09 ENCOUNTER — Ambulatory Visit (HOSPITAL_COMMUNITY)
Admission: RE | Admit: 2022-03-09 | Discharge: 2022-03-09 | Disposition: A | Discharge status: Home / Self Care | Payer: Medicare Other | Source: Ambulatory Visit | Attending: Cardiovascular Disease | Admitting: Cardiovascular Disease

## 2022-03-09 ENCOUNTER — Other Ambulatory Visit: Payer: Self-pay | Admitting: Cardiovascular Disease

## 2022-03-09 ENCOUNTER — Ambulatory Visit (HOSPITAL_COMMUNITY)
Admission: RE | Admit: 2022-03-09 | Discharge: 2022-03-09 | Disposition: A | Discharge status: Home / Self Care | Payer: Medicare Other | Source: Ambulatory Visit | Attending: Cardiology | Admitting: Cardiology

## 2022-03-09 ENCOUNTER — Other Ambulatory Visit: Payer: Self-pay

## 2022-03-09 ENCOUNTER — Encounter (HOSPITAL_COMMUNITY): Payer: Self-pay

## 2022-03-09 DIAGNOSIS — R931 Abnormal findings on diagnostic imaging of heart and coronary circulation: Secondary | ICD-10-CM

## 2022-03-09 DIAGNOSIS — I2510 Atherosclerotic heart disease of native coronary artery without angina pectoris: Secondary | ICD-10-CM

## 2022-03-09 MED ORDER — NITROGLYCERIN 0.4 MG SL SUBL
SUBLINGUAL_TABLET | SUBLINGUAL | Status: AC
  Filled 2022-03-09: qty 2

## 2022-03-09 MED ORDER — NITROGLYCERIN 0.4 MG SL SUBL
0.8000 mg | SUBLINGUAL_TABLET | Freq: Once | SUBLINGUAL | Status: AC
Start: 2022-03-09 — End: 2022-03-09
  Administered 2022-03-09: 0.8 mg via SUBLINGUAL

## 2022-03-09 MED ORDER — IOHEXOL 350 MG/ML SOLN
100.0000 mL | Freq: Once | INTRAVENOUS | Status: AC | PRN
  Administered 2022-03-09: 100 mL via INTRATHECAL

## 2022-03-11 ENCOUNTER — Other Ambulatory Visit: Payer: Self-pay

## 2022-03-11 ENCOUNTER — Telehealth: Payer: Self-pay

## 2022-03-11 DIAGNOSIS — E782 Mixed hyperlipidemia: Secondary | ICD-10-CM

## 2022-03-11 MED ORDER — ROSUVASTATIN CALCIUM 20 MG PO TABS: 20.0000 mg | ORAL_TABLET | Freq: Every day | ORAL | 3 refills | Status: DC

## 2022-03-11 NOTE — Telephone Encounter (Signed)
Contacted pt to not start ASA as per Dr. Bettina Gavia due to 2018 "bleeding Ulcer" ?

## 2022-03-11 NOTE — Telephone Encounter (Signed)
-----   Message from Richardo Priest, MD sent at 03/11/2022 11:13 AM EDT ----- ?I agree with him at this time I would not have him take aspirin thank you for telling us about his ulcer and reminding [place] ?----- Message ----- ?From: Tyler Pita, RN ?Sent: 03/11/2022  11:09 AM EDT ?To: Richardo Priest, MD ? ?Results reviewed with pt as per Dr. Joya Gaskins note.  Pt verbalized understanding of results and Rosuvastatin. He questioned the '81mg'$  ASA as he had a "bleeding ulcer" in 2018 and wanted to be sure this will be safe for him to start.  ?----- Message ----- ?From: Richardo Priest, MD ?Sent: 03/11/2022   8:30 AM EDT ?To: [facility]p Triage ? ?As expected from the previous CT scan he has atherosclerosis score is very high should be on a statin and recommend rosuvastatin 20 mg daily ? ?There is atherosclerosis, mild to moderate blockage in the coronary arteries not severe does not reduce blood flow and I do not think he needs to undergo heart catheterization or stent ? ?I do think he would benefit from taking low-dose aspirin 81 mg daily. ? ? ?

## 2022-03-16 ENCOUNTER — Other Ambulatory Visit: Payer: Self-pay

## 2022-03-16 MED ORDER — NITROGLYCERIN 0.4 MG SL SUBL: 0.4000 mg | SUBLINGUAL_TABLET | SUBLINGUAL | 6 refills | Status: DC | PRN

## 2022-03-16 NOTE — Progress Notes (Signed)
Atherosclerosis aorta, lungs demonstrate hypersensitivity pneumononitis ?lp

## 2022-03-23 DIAGNOSIS — D692 Other nonthrombocytopenic purpura: Secondary | ICD-10-CM | POA: Diagnosis not present

## 2022-03-23 DIAGNOSIS — L821 Other seborrheic keratosis: Secondary | ICD-10-CM | POA: Diagnosis not present

## 2022-03-23 DIAGNOSIS — L570 Actinic keratosis: Secondary | ICD-10-CM | POA: Diagnosis not present

## 2022-03-23 DIAGNOSIS — Z85828 Personal history of other malignant neoplasm of skin: Secondary | ICD-10-CM | POA: Diagnosis not present

## 2022-04-06 DIAGNOSIS — M25561 Pain in right knee: Secondary | ICD-10-CM | POA: Diagnosis not present

## 2022-04-06 DIAGNOSIS — M1711 Unilateral primary osteoarthritis, right knee: Secondary | ICD-10-CM | POA: Diagnosis not present

## 2022-04-06 DIAGNOSIS — M11261 Other chondrocalcinosis, right knee: Secondary | ICD-10-CM | POA: Diagnosis not present

## 2022-04-13 DIAGNOSIS — M25561 Pain in right knee: Secondary | ICD-10-CM | POA: Diagnosis not present

## 2022-04-13 DIAGNOSIS — S83241A Other tear of medial meniscus, current injury, right knee, initial encounter: Secondary | ICD-10-CM | POA: Diagnosis not present

## 2022-04-13 DIAGNOSIS — Z01818 Encounter for other preprocedural examination: Secondary | ICD-10-CM | POA: Diagnosis not present

## 2022-04-14 ENCOUNTER — Other Ambulatory Visit: Payer: Self-pay | Admitting: Legal Medicine

## 2022-04-14 ENCOUNTER — Telehealth: Payer: Self-pay

## 2022-04-14 DIAGNOSIS — J432 Centrilobular emphysema: Secondary | ICD-10-CM

## 2022-04-14 NOTE — Telephone Encounter (Signed)
Patient was informed.

## 2022-04-14 NOTE — Telephone Encounter (Signed)
Patient calling requesting pulmonologist referral follow up due to September 2022 CT Lung Screen recommendations. Refer w/o appointment or need appointment?  ? ?Callback: [phone] ? ?Adrian Allen, [place] 04/14/22 10:40 AM ? ?

## 2022-04-20 DIAGNOSIS — M238X1 Other internal derangements of right knee: Secondary | ICD-10-CM | POA: Diagnosis not present

## 2022-04-20 DIAGNOSIS — S83281A Other tear of lateral meniscus, current injury, right knee, initial encounter: Secondary | ICD-10-CM | POA: Diagnosis not present

## 2022-04-20 DIAGNOSIS — M1711 Unilateral primary osteoarthritis, right knee: Secondary | ICD-10-CM | POA: Diagnosis not present

## 2022-04-20 DIAGNOSIS — S83241A Other tear of medial meniscus, current injury, right knee, initial encounter: Secondary | ICD-10-CM | POA: Diagnosis not present

## 2022-04-20 NOTE — Progress Notes (Signed)
?Cardiology Office Note:   ? ?Date:  04/21/2022  ? ?ID:  Adrian Allen, DOB 18-Nov-1951, MRN [id number] ? ?PCP:  Lillard Anes, MD  ?Cardiologist:  Shirlee More, MD   ? ?Referring MD: Lillard Anes,*  ? ? ?ASSESSMENT:   ? ?1. Coronary artery disease of native artery of native heart with stable angina pectoris ([place])   ?2. [place] coronary artery calcium score greater than 400   ? ?PLAN:   ? ?In order of problems listed above: ?Stable CAD having no angina stenosis the LAD is nonflow limiting not on aspirin because of previous ulcer disease and severe GI bleed continue his high intensity statin and has a prescription for nitroglycerin if needed I will plan to see him in the office in 1 year follow-up ? ? ? ? ? ? ? ?Medication Adjustments/Labs and Tests Ordered: ?Current medicines are reviewed at length with the patient today.  Concerns regarding medicines are outlined above.  ?No orders of the defined types were placed in this encounter. ? ?No orders of the defined types were placed in this encounter. ? ? ?Chief Complaint  ?Patient presents with  ? Follow-up  ? Coronary Artery Disease  ? ? ?History of Present Illness:   ? ?Adrian Allen is a 71 y.o. male with a hx of hyperlipidemia and coronary calcification on CT scan last seen 02/22/2022.He is a previous smoker has a chronic cough and underwent a CT of the chest lung cancer screening protocol that showed extensive coronary artery calcification that would generally correlate with a very high coronary artery calcium score.  ? ?Compliance with diet, lifestyle and medications: Yes ? ?His wife is present participates in evaluation decision making ?He understands the reason for lipid-lowering therapy he is compliant with and tolerates his rosuvastatin ?He is not having cardiovascular symptoms of chest pain shortness of breath edema palpitation or syncope ?He has a prescription for nitroglycerin if needed ? ?He underwent cardiac CTA reported  03/09/2022.  Calcium score is quite elevated 613/79th percentile and he had moderate CAD with 50 to 60% calcified plaque in the left anterior descending coronary artery.  That far was normal except for the most distal apical portion of the LAD not consistent with flow-limiting stenosis. ?Past Medical History:  ?Diagnosis Date  ? Acute gastric ulcer with hemorrhage 06/24/2020  ? Arthritis   ? right ankle  ? Closed fracture of upper end of left fibula 09/23/2020  ? Closed nondisplaced fracture of body of right scapula 09/23/2020  ? Closed nondisplaced fracture of third metatarsal bone of left foot 09/23/2020  ? Gastroesophageal reflux disease 06/24/2020  ? Mixed conductive and sensorineural hearing loss, bilateral 06/24/2020  ? Mixed hyperlipidemia 06/24/2020  ? ? ?Past Surgical History:  ?Procedure Laterality Date  ? FRACTURE SURGERY    ? right ankle surgery in 1974  ? SKIN CANCER EXCISION Left 05/25/2020  ? ? ?Current Medications: ?Current Meds  ?Medication Sig  ? nitroGLYCERIN (NITROSTAT) 0.4 MG SL tablet Place 1 tablet (0.4 mg total) under the tongue every 5 (five) minutes as needed.  ? rosuvastatin (CRESTOR) 20 MG tablet Take 1 tablet (20 mg total) by mouth daily.  ?  ? ?Allergies:   Patient has no known allergies.  ? ?Social History  ? ?Socioeconomic History  ? Marital status: Married  ?  Spouse name: Not on file  ? Number of children: 0  ? Years of education: Not on file  ? Highest education level: Not on file  ?Occupational  History  ? Occupation: Retired  ?Tobacco Use  ? Smoking status: Former  ?  Packs/day: 1.00  ?  Types: Cigarettes  ?  Quit date: 2019  ?  Years since quitting: 4.3  ? Smokeless tobacco: Never  ?Vaping Use  ? Vaping Use: Never used  ?Substance and Sexual Activity  ? Alcohol use: Not Currently  ? Drug use: No  ? Sexual activity: Yes  ?  Partners: Female  ?Other Topics Concern  ? Not on file  ?Social History Narrative  ? Not on file  ? ?Social Determinants of Health  ? ?Financial Resource Strain: Not on  file  ?Food Insecurity: Not on file  ?Transportation Needs: Not on file  ?Physical Activity: Not on file  ?Stress: Not on file  ?Social Connections: Not on file  ?  ? ?Family History: ?The patient's family history includes Heart Problems in his brother; Stomach cancer in his father. ?ROS:   ?Please see the history of present illness.    ?All other systems reviewed and are negative. ? ?EKGs/Labs/Other Studies Reviewed:   ? ?The following studies were reviewed today: ? ? ? ?Recent Labs: ?01/07/2022: ALT 16; Hemoglobin 14.5; Platelets 180 ?02/22/2022: BUN 15; Creatinine, Ser 1.24; Potassium 4.5; Sodium 141  ?Recent Lipid Panel ?   ?Component Value Date/Time  ? CHOL 160 01/07/2022 0837  ? TRIG 102 01/07/2022 0837  ? HDL 43 01/07/2022 0837  ? CHOLHDL 3.7 01/07/2022 0837  ? [place] 98 01/07/2022 0837  ? ? ?Physical Exam:   ? ?VS:  BP 112/78 (BP Location: Right Arm, Patient Position: Sitting, Cuff Size: Normal)   Pulse 68   Ht '6\' 1"'$  (1.854 m)   Wt 194 lb (88 kg)   SpO2 96%   BMI 25.60 kg/m?    ? ?Wt Readings from Last 3 Encounters:  ?04/21/22 194 lb (88 kg)  ?02/22/22 191 lb 9.6 oz (86.9 kg)  ?01/07/22 198 lb (89.8 kg)  ?  ? ?GEN:  ? Well nourished, well developed in no acute distress ?HEENT: Normal ?NECK: No JVD; No carotid bruits ?LYMPHATICS: No lymphadenopathy ?CARDIAC: NADRRR,Regular  no murmurs, rubs, gallops ?RESPIRATORY:  Clear to auscultation without rales, wheezing or rhonchi  ?ABDOMEN: Soft, non-tender, non-distended ?MUSCULOSKELETAL:  No edema; No deformity  ?SKIN: Warm and dry ?NEUROLOGIC:  Alert and oriented x 3 ?PSYCHIATRIC:  Normal affect  ? ? ?Signed, ?Shirlee More, MD  ?04/21/2022 10:45 AM    ?[place]  ?

## 2022-04-21 ENCOUNTER — Encounter: Payer: Self-pay | Admitting: Cardiology

## 2022-04-21 ENCOUNTER — Ambulatory Visit: Payer: Medicare Other | Admitting: Cardiology

## 2022-04-21 VITALS — BP 112/78 | HR 68 | Ht 73.0 in | Wt 194.0 lb

## 2022-04-21 DIAGNOSIS — R931 Abnormal findings on diagnostic imaging of heart and coronary circulation: Secondary | ICD-10-CM | POA: Diagnosis not present

## 2022-04-21 DIAGNOSIS — I25118 Atherosclerotic heart disease of native coronary artery with other forms of angina pectoris: Secondary | ICD-10-CM | POA: Diagnosis not present

## 2022-04-21 NOTE — Patient Instructions (Signed)

## 2022-05-05 DIAGNOSIS — M6751 Plica syndrome, right knee: Secondary | ICD-10-CM | POA: Diagnosis not present

## 2022-05-05 DIAGNOSIS — M1711 Unilateral primary osteoarthritis, right knee: Secondary | ICD-10-CM | POA: Diagnosis not present

## 2022-05-05 DIAGNOSIS — M94261 Chondromalacia, right knee: Secondary | ICD-10-CM | POA: Diagnosis not present

## 2022-05-05 DIAGNOSIS — S83231A Complex tear of medial meniscus, current injury, right knee, initial encounter: Secondary | ICD-10-CM | POA: Diagnosis not present

## 2022-05-05 DIAGNOSIS — Y999 Unspecified external cause status: Secondary | ICD-10-CM | POA: Diagnosis not present

## 2022-05-05 DIAGNOSIS — M65861 Other synovitis and tenosynovitis, right lower leg: Secondary | ICD-10-CM | POA: Diagnosis not present

## 2022-05-05 DIAGNOSIS — M659 Synovitis and tenosynovitis, unspecified: Secondary | ICD-10-CM | POA: Diagnosis not present

## 2022-05-05 DIAGNOSIS — S83271A Complex tear of lateral meniscus, current injury, right knee, initial encounter: Secondary | ICD-10-CM | POA: Diagnosis not present

## 2022-05-05 DIAGNOSIS — G8918 Other acute postprocedural pain: Secondary | ICD-10-CM | POA: Diagnosis not present

## 2022-05-05 DIAGNOSIS — X58XXXA Exposure to other specified factors, initial encounter: Secondary | ICD-10-CM | POA: Diagnosis not present

## 2022-05-10 NOTE — Progress Notes (Unsigned)
Synopsis: Referred for emphysema by Lillard Anes,*  Subjective:   PATIENT ID: Adrian Allen GENDER: male DOB: 1951-08-18, MRN: [id number]  No chief complaint on file.  21yM withhistory of GERD, bronchiectasis, possible progressive chronic HP  Otherwise pertinent review of systems is negative.  Past Medical History:  Diagnosis Date   Acute gastric ulcer with hemorrhage 06/24/2020   Arthritis    right ankle   Closed fracture of upper end of left fibula 09/23/2020   Closed nondisplaced fracture of body of right scapula 09/23/2020   Closed nondisplaced fracture of third metatarsal bone of left foot 09/23/2020   Gastroesophageal reflux disease 06/24/2020   Mixed conductive and sensorineural hearing loss, bilateral 06/24/2020   Mixed hyperlipidemia 06/24/2020     Family History  Problem Relation Age of Onset   Stomach cancer Father    Heart Problems Brother      Past Surgical History:  Procedure Laterality Date   FRACTURE SURGERY     right ankle surgery in [place] Left 05/25/2020    Social History   Socioeconomic History   Marital status: Married    Spouse name: Not on file   Number of children: 0   Years of education: Not on file   Highest education level: Not on file  Occupational History   Occupation: Retired  Tobacco Use   Smoking status: Former    Packs/day: 1.00    Types: Cigarettes    Quit date: 2019    Years since quitting: 4.3   Smokeless tobacco: Never  Vaping Use   Vaping Use: Never used  Substance and Sexual Activity   Alcohol use: Not Currently   Drug use: No   Sexual activity: Yes    Partners: Female  Other Topics Concern   Not on file  Social History Narrative   Not on file   Social Determinants of Health   Financial Resource Strain: Not on file  Food Insecurity: Not on file  Transportation Needs: Not on file  Physical Activity: Not on file  Stress: Not on file  Social Connections: Not on file  Intimate  Partner Violence: Not on file     No Known Allergies   Outpatient Medications Prior to Visit  Medication Sig Dispense Refill   nitroGLYCERIN (NITROSTAT) 0.4 MG SL tablet Place 1 tablet (0.4 mg total) under the tongue every 5 (five) minutes as needed. 25 tablet 6   rosuvastatin (CRESTOR) 20 MG tablet Take 1 tablet (20 mg total) by mouth daily. 90 tablet 3   No facility-administered medications prior to visit.       Objective:   Physical Exam:  General appearance: 71 y.o., male, NAD, conversant  Eyes: anicteric sclerae; PERRL, tracking appropriately HENT: NCAT; MMM Neck: Trachea midline; no lymphadenopathy, no JVD Lungs: CTAB, no crackles, no wheeze, with normal respiratory effort CV: RRR, no murmur  Abdomen: Soft, non-tender; non-distended, BS present  Extremities: No peripheral edema, warm Skin: Normal turgor and texture; no rash Psych: Appropriate affect Neuro: Alert and oriented to person and place, no focal deficit     There were no vitals filed for this visit.   on *** LPM *** RA BMI Readings from Last 3 Encounters:  04/21/22 25.60 kg/m  02/22/22 25.28 kg/m  01/07/22 26.12 kg/m   Wt Readings from Last 3 Encounters:  04/21/22 194 lb (88 kg)  02/22/22 191 lb 9.6 oz (86.9 kg)  01/07/22 198 lb (89.8 kg)     CBC  Component Value Date/Time   WBC 7.7 01/07/2022 0837   RBC 4.44 01/07/2022 0837   HGB 14.5 01/07/2022 0837   HCT 42.2 01/07/2022 0837   PLT 180 01/07/2022 0837   MCV 95 01/07/2022 0837   MCH 32.7 01/07/2022 0837   MCHC 34.4 01/07/2022 0837   RDW 11.9 01/07/2022 0837   LYMPHSABS 2.2 01/07/2022 0837   EOSABS 0.2 01/07/2022 0837   BASOSABS 0.1 01/07/2022 0837    ***  Chest Imaging: CT Chest 03/09/22 reviewed by me grossly stable extent of fibrotic lung disease in bases  LDCT Chest 2022 reviewed by me with bronchial wall thickening, traction, subplueral reticulation, early honeycomb change - reportedly progressive since 2021  Pulmonary  Functions Testing Results:     View : No data to display.          FeNO: ***  Pathology: ***  Echocardiogram: ***  Heart Catheterization: ***    Assessment & Plan:    Plan:      Maryjane Hurter, MD [facility] 05/10/2022 1:40 PM

## 2022-05-11 ENCOUNTER — Ambulatory Visit: Payer: Medicare Other | Admitting: Student

## 2022-05-11 ENCOUNTER — Encounter: Payer: Self-pay | Admitting: Student

## 2022-05-11 VITALS — BP 126/62 | HR 76 | Temp 98.1°F | Ht 73.0 in | Wt 194.6 lb

## 2022-05-11 DIAGNOSIS — J849 Interstitial pulmonary disease, unspecified: Secondary | ICD-10-CM

## 2022-05-11 MED ORDER — PANTOPRAZOLE SODIUM 40 MG PO TBEC: 40.0000 mg | DELAYED_RELEASE_TABLET | Freq: Every day | ORAL | 0 refills | Status: DC

## 2022-05-11 NOTE — Patient Instructions (Addendum)
-   pantoprazole 40 mg 30 minutes before breakfast for cough/reflux - labs and breathing tests next visit - TTE, CT Chest you will be called to schedule them - see you in 6 weeks!

## 2022-05-12 DIAGNOSIS — M25561 Pain in right knee: Secondary | ICD-10-CM | POA: Diagnosis not present

## 2022-05-18 ENCOUNTER — Ambulatory Visit (INDEPENDENT_AMBULATORY_CARE_PROVIDER_SITE_OTHER): Payer: Medicare Other

## 2022-05-18 DIAGNOSIS — I5030 Unspecified diastolic (congestive) heart failure: Secondary | ICD-10-CM | POA: Diagnosis not present

## 2022-05-18 DIAGNOSIS — J849 Interstitial pulmonary disease, unspecified: Secondary | ICD-10-CM | POA: Diagnosis not present

## 2022-05-18 DIAGNOSIS — I361 Nonrheumatic tricuspid (valve) insufficiency: Secondary | ICD-10-CM

## 2022-05-18 LAB — ECHOCARDIOGRAM COMPLETE
Area-P 1/2: 2.32 cm2
S' Lateral: 3.1 cm

## 2022-05-19 DIAGNOSIS — M25561 Pain in right knee: Secondary | ICD-10-CM | POA: Diagnosis not present

## 2022-05-26 DIAGNOSIS — M25561 Pain in right knee: Secondary | ICD-10-CM | POA: Diagnosis not present

## 2022-06-02 DIAGNOSIS — M25561 Pain in right knee: Secondary | ICD-10-CM | POA: Diagnosis not present

## 2022-06-03 ENCOUNTER — Ambulatory Visit
Admission: RE | Admit: 2022-06-03 | Discharge: 2022-06-03 | Disposition: A | Discharge status: Home / Self Care | Payer: Medicare Other | Source: Ambulatory Visit | Attending: Student | Admitting: Student

## 2022-06-03 DIAGNOSIS — J84112 Idiopathic pulmonary fibrosis: Secondary | ICD-10-CM | POA: Diagnosis not present

## 2022-06-03 DIAGNOSIS — J479 Bronchiectasis, uncomplicated: Secondary | ICD-10-CM | POA: Diagnosis not present

## 2022-06-03 DIAGNOSIS — J849 Interstitial pulmonary disease, unspecified: Secondary | ICD-10-CM

## 2022-06-03 DIAGNOSIS — I700 Atherosclerosis of aorta: Secondary | ICD-10-CM | POA: Diagnosis not present

## 2022-06-03 DIAGNOSIS — J439 Emphysema, unspecified: Secondary | ICD-10-CM | POA: Diagnosis not present

## 2022-06-08 DIAGNOSIS — M25561 Pain in right knee: Secondary | ICD-10-CM | POA: Diagnosis not present

## 2022-06-28 ENCOUNTER — Ambulatory Visit: Payer: Medicare Other | Admitting: Student

## 2022-07-07 ENCOUNTER — Ambulatory Visit: Payer: Medicare Other | Admitting: Legal Medicine

## 2022-07-13 ENCOUNTER — Ambulatory Visit (INDEPENDENT_AMBULATORY_CARE_PROVIDER_SITE_OTHER): Payer: Medicare Other | Admitting: Student

## 2022-07-13 DIAGNOSIS — J849 Interstitial pulmonary disease, unspecified: Secondary | ICD-10-CM | POA: Diagnosis not present

## 2022-07-13 LAB — PULMONARY FUNCTION TEST
DL/VA % pred: 64 %
DL/VA: 2.58 ml/min/mmHg/L
DLCO cor % pred: 54 %
DLCO cor: 15.54 ml/min/mmHg
DLCO unc % pred: 54 %
DLCO unc: 15.54 ml/min/mmHg
FEF 25-75 Post: 3.04 L/sec
FEF 25-75 Pre: 2.56 L/sec
FEF2575-%Change-Post: 18 %
FEF2575-%Pred-Post: 108 %
FEF2575-%Pred-Pre: 90 %
FEV1-%Change-Post: 3 %
FEV1-%Pred-Post: 88 %
FEV1-%Pred-Pre: 85 %
FEV1-Post: 3.25 L
FEV1-Pre: 3.14 L
FEV1FVC-%Change-Post: 0 %
FEV1FVC-%Pred-Pre: 105 %
FEV6-%Change-Post: 3 %
FEV6-%Pred-Post: 88 %
FEV6-%Pred-Pre: 85 %
FEV6-Post: 4.18 L
FEV6-Pre: 4.05 L
FEV6FVC-%Pred-Post: 105 %
FEV6FVC-%Pred-Pre: 105 %
FVC-%Change-Post: 3 %
FVC-%Pred-Post: 83 %
FVC-%Pred-Pre: 81 %
FVC-Post: 4.18 L
FVC-Pre: 4.05 L
Post FEV1/FVC ratio: 78 %
Post FEV6/FVC ratio: 100 %
Pre FEV1/FVC ratio: 78 %
Pre FEV6/FVC Ratio: 100 %
RV % pred: 96 %
RV: 2.51 L
TLC % pred: 87 %
TLC: 6.66 L

## 2022-07-13 NOTE — Progress Notes (Unsigned)
Synopsis: Referred for emphysema by Lillard Anes,*  Subjective:   PATIENT ID: Adrian Allen GENDER: male DOB: 1951-11-21, MRN: [id number]  No chief complaint on file.  71yM with history of GERD takes famotidine as needed, bronchiectasis, possible progressive chronic HP  He has no DOE relative to peers without lung disease. He has a cough but it's not particularly bothersome to him. Bothers his wife. No dysphagia to solids/liquids, rashes, ulcers in mouth or nose, raynaud's phenomenon.  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 [place], [place] then [place]. No vaping. MJ when much younger. No hot tub/sauna, pet birds, feather pillows, down comforters.   Interval HPI: Given ppi for cough, HRCT performed which to me looks stable relative to last year, TTE without PH, PFTs with moderately reduced diffusign capacity, awaiting CTD/HP serologies.  Otherwise pertinent review of systems is negative.  Past Medical History:  Diagnosis Date   Acute gastric ulcer with hemorrhage 06/24/2020   Arthritis    right ankle   Closed fracture of upper end of left fibula 09/23/2020   Closed nondisplaced fracture of body of right scapula 09/23/2020   Closed nondisplaced fracture of third metatarsal bone of left foot 09/23/2020   Gastroesophageal reflux disease 06/24/2020   Mixed conductive and sensorineural hearing loss, bilateral 06/24/2020   Mixed hyperlipidemia 06/24/2020     Family History  Problem Relation Age of Onset   Stomach cancer Father    Heart Problems Brother      Past Surgical History:  Procedure Laterality Date   FRACTURE SURGERY     right ankle surgery in [place] Left 05/25/2020    Social History   Socioeconomic History   Marital status: Married    Spouse name: Not on file   Number of children: 0   Years of education:  Not on file   Highest education level: Not on file  Occupational History   Occupation: Retired  Tobacco Use   Smoking status: Former    Packs/day: 1.50    Years: 48.00    Total pack years: 72.00    Types: Cigarettes    Quit date: 05/20/2020    Years since quitting: 2.1   Smokeless tobacco: Never  Vaping Use   Vaping Use: Never used  Substance and Sexual Activity   Alcohol use: Not Currently   Drug use: No   Sexual activity: Yes    Partners: Female  Other Topics Concern   Not on file  Social History Narrative   Not on file   Social Determinants of Health   Financial Resource Strain: Not on file  Food Insecurity: Not on file  Transportation Needs: Not on file  Physical Activity: Not on file  Stress: Not on file  Social Connections: Not on file  Intimate Partner Violence: Not on file     No Known Allergies   Outpatient Medications Prior to Visit  Medication Sig Dispense Refill   nitroGLYCERIN (NITROSTAT) 0.4 MG SL tablet Place 1 tablet (0.4 mg total) under the tongue every 5 (five) minutes as needed. 25 tablet 6   pantoprazole (PROTONIX) 40 MG tablet Take 1 tablet (40 mg total) by mouth daily. 90 tablet 0   rosuvastatin (CRESTOR) 20 MG tablet Take 1 tablet (20 mg total) by mouth daily. 90 tablet 3   No facility-administered medications prior to visit.  Objective:   Physical Exam:  General appearance: 71 y.o., male, NAD, conversant  Eyes: anicteric sclerae; PERRL, tracking appropriately HENT: NCAT; MMM Neck: Trachea midline; no lymphadenopathy, no JVD Lungs: no crackles that I can hear with my disposable stethoscope, with normal respiratory effort CV: RRR, no murmur  Abdomen: Soft, non-tender; non-distended, BS present  Extremities: No peripheral edema, warm Skin: Normal turgor and texture; no rash Psych: Appropriate affect Neuro: Alert and oriented to person and place, no focal deficit     There were no vitals filed for this visit.    on RA BMI  Readings from Last 3 Encounters:  05/11/22 25.67 kg/m  04/21/22 25.60 kg/m  02/22/22 25.28 kg/m   Wt Readings from Last 3 Encounters:  05/11/22 194 lb 9.6 oz (88.3 kg)  04/21/22 194 lb (88 kg)  02/22/22 191 lb 9.6 oz (86.9 kg)     CBC    Component Value Date/Time   WBC 7.7 01/07/2022 0837   RBC 4.44 01/07/2022 0837   HGB 14.5 01/07/2022 0837   HCT 42.2 01/07/2022 0837   PLT 180 01/07/2022 0837   MCV 95 01/07/2022 0837   MCH 32.7 01/07/2022 0837   MCHC 34.4 01/07/2022 0837   RDW 11.9 01/07/2022 0837   LYMPHSABS 2.2 01/07/2022 0837   EOSABS 0.2 01/07/2022 0837   BASOSABS 0.1 01/07/2022 0837    Chest Imaging: HRCT Chest 06/03/22 reviewed by me pretty stable relative to LDCT Chest 2022, no LAD  CT Chest 03/09/22 reviewed by me grossly stable extent of fibrotic lung disease in bases  LDCT Chest 2022 reviewed by me with bronchial wall thickening, traction, subplueral reticulation, early honeycomb change - reportedly progressive since 2021  Pulmonary Functions Testing Results:    Latest Ref Rng & Units 07/13/2022    3:45 PM  PFT Results  FVC-Pre L 4.05  P  FVC-Predicted Pre % 81  P  FVC-Post L 4.18  P  FVC-Predicted Post % 83  P  Pre FEV1/FVC % % 78  P  Post FEV1/FCV % % 78  P  FEV1-Pre L 3.14  P  FEV1-Predicted Pre % 85  P  FEV1-Post L 3.25  P  DLCO uncorrected ml/min/mmHg 15.54  P  DLCO UNC% % 54  P  DLCO corrected ml/min/mmHg 15.54  P  DLCO COR %Predicted % 54  P  DLVA Predicted % 64  P  TLC L 6.66  P  TLC % Predicted % 87  P  RV % Predicted % 96  P    P Preliminary result   PFT 07/13/22 reviewed by me with moderately reduced diffusing capacity  TTE 05/18/22:  1. Left ventricular ejection fraction, by estimation, is 60 to 65%. The  left ventricle has normal function. The left ventricle has no regional  wall motion abnormalities. Left ventricular diastolic parameters are  consistent with Grade I diastolic  dysfunction (impaired relaxation).   2. Right  ventricular systolic function is normal. The right ventricular  size is normal. There is normal pulmonary artery systolic pressure.   3. The mitral valve is normal in structure. Trivial mitral valve  regurgitation. No evidence of mitral stenosis.   4. The aortic valve is normal in structure. Aortic valve regurgitation is  not visualized. No aortic stenosis is present.   5. The inferior vena cava is normal in size with greater than 50%  respiratory variability, suggesting right atrial pressure of 3 mmHg.     Assessment & Plan:   # ILD Subpleural predominant  reticular opacities and fibrosis, traction bronchiectasis that appears progressive - to me it looks like probable UIP pattern. Unclear etiology - metal-working related HP, metal dust pneumoconiosis but not a lot of LAD, IPF possibilities. Low suspicion for CTD ILD.   # Chronic cough GERD vs ILD  Plan: - pantoprazole 40 mg 30 minutes before breakfast for cough/reflux - ANA, RF, CCP, CK and breathing tests next visit - TTE, CT Chest you will be called to schedule them - see you in 6 weeks!    Maryjane Hurter, MD [facility] 07/13/2022 5:27 PM

## 2022-07-13 NOTE — Progress Notes (Signed)
Full PFT performed today. °

## 2022-07-13 NOTE — Patient Instructions (Signed)
Full PFT performed today. °

## 2022-07-15 ENCOUNTER — Encounter: Payer: Self-pay | Admitting: Student

## 2022-07-15 ENCOUNTER — Ambulatory Visit: Payer: Medicare Other | Admitting: Student

## 2022-07-15 VITALS — BP 122/76 | HR 61 | Temp 98.0°F | Ht 73.0 in | Wt 199.4 lb

## 2022-07-15 DIAGNOSIS — R053 Chronic cough: Secondary | ICD-10-CM | POA: Diagnosis not present

## 2022-07-15 DIAGNOSIS — J849 Interstitial pulmonary disease, unspecified: Secondary | ICD-10-CM

## 2022-07-15 MED ORDER — PANTOPRAZOLE SODIUM 40 MG PO TBEC: 40.0000 mg | DELAYED_RELEASE_TABLET | Freq: Every day | ORAL | 1 refills | Status: DC

## 2022-07-15 NOTE — Patient Instructions (Addendum)
- pantoprazole 40 mg 30 minutes before breakfast for cough/reflux - spirometry (simple breathing tests) in the office next visit in 6 months  - nintedanib (ofev), pirfenidone (esbriet) are two medications that work against lung scarring for patients with IPF (idiopathic pulmonary fibrosis) and progressive pulmonary fibrosis from other causes as well. Think also about whether or not you'd like to order lab tests to look for connective tissue diseases which can cause lung scarring. - see you in 6 months!   Gastroesophageal Reflux Disease, Adult Gastroesophageal reflux (GER) happens when acid from the stomach flows up into the tube that connects the mouth and the stomach (esophagus). Normally, food travels down the esophagus and stays in the stomach to be digested. However, when a person has GER, food and stomach acid sometimes move back up into the esophagus. If this becomes a more serious problem, the person may be diagnosed with a disease called gastroesophageal reflux disease (GERD). GERD occurs when the reflux: Happens often. Causes frequent or severe symptoms. Causes problems such as damage to the esophagus. When stomach acid comes in contact with the esophagus, the acid may cause inflammation in the esophagus. Over time, GERD may create small holes (ulcers) in the lining of the esophagus. What are the causes? This condition is caused by a problem with the muscle between the esophagus and the stomach (lower esophageal sphincter, or LES). Normally, the LES muscle closes after food passes through the esophagus to the stomach. When the LES is weakened or abnormal, it does not close properly, and that allows food and stomach acid to go back up into the esophagus. The LES can be weakened by certain dietary substances, medicines, and medical conditions, including: Tobacco use. Pregnancy. Having a hiatal hernia. Alcohol use. Certain foods and beverages, such as coffee, chocolate, onions, and  peppermint. What increases the risk? You are more likely to develop this condition if you: Have an increased body weight. Have a connective tissue disorder. Take NSAIDs, such as ibuprofen. What are the signs or symptoms? Symptoms of this condition include: Heartburn. Difficult or painful swallowing and the feeling of having a lump in the throat. A bitter taste in the mouth. Bad breath and having a large amount of saliva. Having an upset or bloated stomach and belching. Chest pain. Different conditions can cause chest pain. Make sure you see your health care provider if you experience chest pain. Shortness of breath or wheezing. Ongoing (chronic) cough or a nighttime cough. Wearing away of tooth enamel. Weight loss. How is this diagnosed? This condition may be diagnosed based on a medical history and a physical exam. To determine if you have mild or severe GERD, your health care provider may also monitor how you respond to treatment. You may also have tests, including: A test to examine your stomach and esophagus with a small camera (endoscopy). A test that measures the acidity level in your esophagus. A test that measures how much pressure is on your esophagus. A barium swallow or modified barium swallow test to show the shape, size, and functioning of your esophagus. How is this treated? Treatment for this condition may vary depending on how severe your symptoms are. Your health care provider may recommend: Changes to your diet. Medicine. Surgery. The goal of treatment is to help relieve your symptoms and to prevent complications. Follow these instructions at home: Eating and drinking  Follow a diet as recommended by your health care provider. This may involve avoiding foods and drinks such as: Coffee and  tea, with or without caffeine. Drinks that contain alcohol. Energy drinks and sports drinks. Carbonated drinks or sodas. Chocolate and cocoa. Peppermint and mint  flavorings. Garlic and onions. Horseradish. Spicy and acidic foods, including peppers, chili powder, curry powder, vinegar, hot sauces, and barbecue sauce. Citrus fruit juices and citrus fruits, such as oranges, lemons, and limes. Tomato-based foods, such as red sauce, chili, salsa, and pizza with red sauce. Fried and fatty foods, such as donuts, french fries, potato chips, and high-fat dressings. High-fat meats, such as hot dogs and fatty cuts of red and white meats, such as rib eye steak, sausage, ham, and bacon. High-fat dairy items, such as whole milk, butter, and cream cheese. Eat small, frequent meals instead of large meals. Avoid drinking large amounts of liquid with your meals. Avoid eating meals during the 2-3 hours before bedtime. Avoid lying down right after you eat. Do not exercise right after you eat. Lifestyle  Do not use any products that contain nicotine or tobacco. These products include cigarettes, chewing tobacco, and vaping devices, such as e-cigarettes. If you need help quitting, ask your health care provider. Try to reduce your stress by using methods such as yoga or meditation. If you need help reducing stress, ask your health care provider. If you are overweight, reduce your weight to an amount that is healthy for you. Ask your health care provider for guidance about a safe weight loss goal. General instructions Pay attention to any changes in your symptoms. Take over-the-counter and prescription medicines only as told by your health care provider. Do not take aspirin, ibuprofen, or other NSAIDs unless your health care provider told you to take these medicines. Wear loose-fitting clothing. Do not wear anything tight around your waist that causes pressure on your abdomen. Raise (elevate) the head of your bed about 6 inches (15 cm). You can use a wedge to do this. Avoid bending over if this makes your symptoms worse. Keep all follow-up visits. This is  important. Contact a health care provider if: You have: New symptoms. Unexplained weight loss. Difficulty swallowing or it hurts to swallow. Wheezing or a persistent cough. A hoarse voice. Your symptoms do not improve with treatment. Get help right away if: You have sudden pain in your arms, neck, jaw, teeth, or back. You suddenly feel sweaty, dizzy, or light-headed. You have chest pain or shortness of breath. You vomit and the vomit is green, yellow, or black, or it looks like blood or coffee grounds. You faint. You have stool that is red, bloody, or black. You cannot swallow, drink, or eat. These symptoms may represent a serious problem that is an emergency. Do not wait to see if the symptoms will go away. Get medical help right away. Call your local emergency services (911 in the [place] Do not drive yourself to the hospital. Summary Gastroesophageal reflux happens when acid from the stomach flows up into the esophagus. GERD is a disease in which the reflux happens often, causes frequent or severe symptoms, or causes problems such as damage to the esophagus. Treatment for this condition may vary depending on how severe your symptoms are. Your health care provider may recommend diet and lifestyle changes, medicine, or surgery. Contact a health care provider if you have new or worsening symptoms. Take over-the-counter and prescription medicines only as told by your health care provider. Do not take aspirin, ibuprofen, or other NSAIDs unless your health care provider told you to do so. Keep all follow-up visits as told by  your health care provider. This is important. This information is not intended to replace advice given to you by your health care provider. Make sure you discuss any questions you have with your health care provider. Document Revised: 06/16/2020 Document Reviewed: 06/16/2020 Elsevier Patient Education  [place].

## 2022-07-22 ENCOUNTER — Ambulatory Visit: Payer: Medicare Other | Admitting: Critical Care Medicine

## 2022-08-05 ENCOUNTER — Encounter: Payer: Self-pay | Admitting: Family Medicine

## 2022-08-05 ENCOUNTER — Ambulatory Visit (INDEPENDENT_AMBULATORY_CARE_PROVIDER_SITE_OTHER): Payer: Medicare Other | Admitting: Family Medicine

## 2022-08-05 VITALS — BP 110/68 | HR 78 | Temp 97.2°F | Resp 16 | Ht 73.0 in | Wt 202.0 lb

## 2022-08-05 DIAGNOSIS — E782 Mixed hyperlipidemia: Secondary | ICD-10-CM | POA: Diagnosis not present

## 2022-08-05 DIAGNOSIS — Z1159 Encounter for screening for other viral diseases: Secondary | ICD-10-CM

## 2022-08-05 DIAGNOSIS — K219 Gastro-esophageal reflux disease without esophagitis: Secondary | ICD-10-CM

## 2022-08-05 DIAGNOSIS — Z8711 Personal history of peptic ulcer disease: Secondary | ICD-10-CM

## 2022-08-05 NOTE — Patient Instructions (Signed)
Recommend tetantus (TDAP) and shingrix vaccines.

## 2022-08-05 NOTE — Assessment & Plan Note (Signed)
Well controlled.  ?No changes to medicines.  ?Continue to work on eating a healthy diet and exercise.  ?Labs drawn today.  ?

## 2022-08-05 NOTE — Assessment & Plan Note (Signed)
The current medical regimen is effective;  continue present plan and medications.  

## 2022-08-05 NOTE — Progress Notes (Signed)
Subjective:  Patient ID: Adrian Allen, male    DOB: [birth year]  Age: 71 y.o. MRN: [id number]  Chief Complaint  Patient presents with   Hyperlipidemia   Gastroesophageal Reflux    HPI Right knee pain s/p arthroscopic knee surgery 2 months ago by Dr. Ronnie Derby. Did not help, but is not worse. No ice or heat.   Hyperlipidemia:  Crestor 20 mg daily for the last couple weeks.   GERD/PUD (2018):  Pantoprazole 40 mg daily.    Current Outpatient Medications on File Prior to Visit  Medication Sig Dispense Refill   nitroGLYCERIN (NITROSTAT) 0.4 MG SL tablet Place 1 tablet (0.4 mg total) under the tongue every 5 (five) minutes as needed. 25 tablet 6   pantoprazole (PROTONIX) 40 MG tablet Take 1 tablet (40 mg total) by mouth daily. 90 tablet 1   rosuvastatin (CRESTOR) 20 MG tablet Take 1 tablet (20 mg total) by mouth daily. 90 tablet 3   No current facility-administered medications on file prior to visit.   Past Medical History:  Diagnosis Date   Acute gastric ulcer with hemorrhage 06/24/2020   Arthritis    right ankle   Closed fracture of upper end of left fibula 09/23/2020   Closed nondisplaced fracture of body of right scapula 09/23/2020   Closed nondisplaced fracture of third metatarsal bone of left foot 09/23/2020   Gastroesophageal reflux disease 06/24/2020   Mixed conductive and sensorineural hearing loss, bilateral 06/24/2020   Mixed hyperlipidemia 06/24/2020   Past Surgical History:  Procedure Laterality Date   FRACTURE SURGERY     right ankle surgery in [place]  2023   meniscal tears.   SKIN CANCER EXCISION Left 05/25/2020    Family History  Problem Relation Age of Onset   Febrile seizures Mother    Stomach cancer Father    Heart Problems Brother    Heart Problems Maternal Uncle    Birth defects Maternal Uncle    Learning disabilities Maternal Grandmother    Social History   Socioeconomic History   Marital status: Married    Spouse name: Not on file    Number of children: 0   Years of education: Not on file   Highest education level: Not on file  Occupational History   Occupation: Retired  Tobacco Use   Smoking status: Former    Packs/day: 1.50    Years: 48.00    Total pack years: 72.00    Types: Cigarettes    Quit date: 05/20/2020    Years since quitting: 2.2   Smokeless tobacco: Never  Vaping Use   Vaping Use: Never used  Substance and Sexual Activity   Alcohol use: Not Currently   Drug use: No   Sexual activity: Yes    Partners: Female  Other Topics Concern   Not on file  Social History Narrative   Not on file   Social Determinants of Health   Financial Resource Strain: Not on file  Food Insecurity: Not on file  Transportation Needs: Not on file  Physical Activity: Not on file  Stress: Not on file  Social Connections: Not on file    Review of Systems  Constitutional:  Negative for chills and fever.  HENT:  Negative for congestion, rhinorrhea and sore throat.   Respiratory:  Negative for cough and shortness of breath.   Cardiovascular:  Negative for chest pain and palpitations.  Gastrointestinal:  Negative for abdominal pain, constipation, diarrhea, nausea and vomiting.  Genitourinary:  Negative  for dysuria and urgency.  Musculoskeletal:  Positive for arthralgias (knee pain). Negative for back pain and myalgias.  Neurological:  Negative for dizziness and headaches.  Psychiatric/Behavioral:  Negative for dysphoric mood. The patient is not nervous/anxious.      Objective:  BP 110/68   Pulse 78   Temp (!) 97.2 F (36.2 C)   Resp 16   Ht '6\' 1"'$  (1.854 m)   Wt 202 lb (91.6 kg)   BMI 26.65 kg/m      08/05/2022    7:29 AM 07/15/2022    9:18 AM 05/11/2022    2:42 PM  BP/Weight  Systolic BP 630 160 109  Diastolic BP 68 76 62  Wt. (Lbs) 202 199.4 194.6  BMI 26.65 kg/m2 26.31 kg/m2 25.67 kg/m2    Physical Exam Vitals reviewed.  Constitutional:      Appearance: Normal appearance.  Neck:     Vascular:  No carotid bruit.  Cardiovascular:     Rate and Rhythm: Normal rate and regular rhythm.     Heart sounds: Normal heart sounds.  Pulmonary:     Effort: Pulmonary effort is normal.     Breath sounds: Normal breath sounds. No wheezing, rhonchi or rales.  Abdominal:     General: Bowel sounds are normal.     Palpations: Abdomen is soft.     Tenderness: There is no abdominal tenderness.  Neurological:     Mental Status: He is alert and oriented to person, place, and time.  Psychiatric:        Mood and Affect: Mood normal.        Behavior: Behavior normal.     Diabetic Foot Exam - Simple   No data filed      Lab Results  Component Value Date   WBC 7.3 08/05/2022   HGB 13.0 08/05/2022   HCT 39.1 08/05/2022   PLT 146 (L) 08/05/2022   GLUCOSE 105 (H) 08/05/2022   CHOL 122 08/05/2022   TRIG 91 08/05/2022   HDL 43 08/05/2022   LDLCALC 61 08/05/2022   ALT 18 08/05/2022   AST 21 08/05/2022   NA 140 08/05/2022   K 4.8 08/05/2022   CL 101 08/05/2022   CREATININE 1.28 (H) 08/05/2022   BUN 14 08/05/2022   CO2 25 08/05/2022   TSH 1.980 06/24/2020   HGBA1C 5.5 01/07/2022      Assessment & Plan:   Problem List Items Addressed This Visit       Digestive   Gastroesophageal reflux disease    The current medical regimen is effective;  continue present plan and medications.         Other   Mixed hyperlipidemia - Primary    Well controlled.  No changes to medicines.  Continue to work on eating a healthy diet and exercise.  Labs drawn today.        Relevant Orders   CBC with Differential/Platelet (Completed)   Comprehensive metabolic panel (Completed)   Lipid panel (Completed)   History of bleeding peptic ulcer   Other Visit Diagnoses     Need for hepatitis C screening test       Relevant Orders   HCV Ab w Reflex to Quant PCR (Completed)     . No orders of the defined types were placed in this encounter.   Orders Placed This Encounter  Procedures   CBC  with Differential/Platelet   Comprehensive metabolic panel   Lipid panel   HCV Ab w Reflex to Quant PCR  Interpretation:   Cardiovascular Risk Assessment    Follow-up: Return in about 6 months (around 02/05/2023) for awv due. please schedule with Maudie Mercury, chronic fasting in 6 months.  An After Visit Summary was printed and given to the patient.  Rochel Brome, MD [facility] [phone]

## 2022-08-06 LAB — CBC WITH DIFFERENTIAL/PLATELET
Basophils Absolute: 0.1 10*3/uL (ref 0.0–0.2)
Basos: 1 %
EOS (ABSOLUTE): 0.2 10*3/uL (ref 0.0–0.4)
Eos: 2 %
Hematocrit: 39.1 % (ref 37.5–51.0)
Hemoglobin: 13 g/dL (ref 13.0–17.7)
Immature Grans (Abs): 0.1 10*3/uL (ref 0.0–0.1)
Immature Granulocytes: 1 %
Lymphocytes Absolute: 2.1 10*3/uL (ref 0.7–3.1)
Lymphs: 29 %
MCH: 31.7 pg (ref 26.6–33.0)
MCHC: 33.2 g/dL (ref 31.5–35.7)
MCV: 95 fL (ref 79–97)
Monocytes Absolute: 0.6 10*3/uL (ref 0.1–0.9)
Monocytes: 9 %
Neutrophils Absolute: 4.2 10*3/uL (ref 1.4–7.0)
Neutrophils: 58 %
Platelets: 146 10*3/uL — ABNORMAL LOW (ref 150–450)
RBC: 4.1 x10E6/uL — ABNORMAL LOW (ref 4.14–5.80)
RDW: 11.7 % (ref 11.6–15.4)
WBC: 7.3 10*3/uL (ref 3.4–10.8)

## 2022-08-06 LAB — COMPREHENSIVE METABOLIC PANEL
ALT: 18 IU/L (ref 0–44)
AST: 21 IU/L (ref 0–40)
Albumin/Globulin Ratio: 1.6 (ref 1.2–2.2)
Albumin: 4.1 g/dL (ref 3.9–4.9)
Alkaline Phosphatase: 111 IU/L (ref 44–121)
BUN/Creatinine Ratio: 11 (ref 10–24)
BUN: 14 mg/dL (ref 8–27)
Bilirubin Total: 0.6 mg/dL (ref 0.0–1.2)
CO2: 25 mmol/L (ref 20–29)
Calcium: 9.1 mg/dL (ref 8.6–10.2)
Chloride: 101 mmol/L (ref 96–106)
Creatinine, Ser: 1.28 mg/dL — ABNORMAL HIGH (ref 0.76–1.27)
Globulin, Total: 2.5 g/dL (ref 1.5–4.5)
Glucose: 105 mg/dL — ABNORMAL HIGH (ref 70–99)
Potassium: 4.8 mmol/L (ref 3.5–5.2)
Sodium: 140 mmol/L (ref 134–144)
Total Protein: 6.6 g/dL (ref 6.0–8.5)
eGFR: 61 mL/min/{1.73_m2} (ref >59)

## 2022-08-06 LAB — LIPID PANEL
Chol/HDL Ratio: 2.8 ratio (ref 0.0–5.0)
Cholesterol, Total: 122 mg/dL (ref 100–199)
HDL: 43 mg/dL (ref >39)
LDL Chol Calc (NIH): 61 mg/dL (ref 0–99)
Triglycerides: 91 mg/dL (ref 0–149)
VLDL Cholesterol Cal: 18 mg/dL (ref 5–40)

## 2022-08-06 LAB — CARDIOVASCULAR RISK ASSESSMENT

## 2022-08-06 LAB — HCV AB W REFLEX TO QUANT PCR: HCV Ab: NONREACTIVE

## 2022-08-08 ENCOUNTER — Encounter: Payer: Self-pay | Admitting: Family Medicine

## 2022-08-12 ENCOUNTER — Ambulatory Visit (INDEPENDENT_AMBULATORY_CARE_PROVIDER_SITE_OTHER): Payer: Medicare Other

## 2022-08-12 VITALS — BP 132/76 | HR 70 | Resp 16 | Ht 73.0 in | Wt 203.6 lb

## 2022-08-12 DIAGNOSIS — Z0000 Encounter for general adult medical examination without abnormal findings: Secondary | ICD-10-CM

## 2022-08-13 NOTE — Progress Notes (Signed)
Subjective:   Adrian Allen is a 71 y.o. male who presents for Medicare Annual/Subsequent preventive examination.  This wellness visit is conducted by a nurse.  The patient's medications were reviewed and reconciled since the patient's last visit.  History details were provided by the patient.  The history appears to be reliable.    Patient's last AWV was one year ago.   Medical History: Patient history and Family history was reviewed  Medications, Allergies, and preventative health maintenance was reviewed and updated.  Cardiac Risk Factors include: advanced age (>52mn, >>4women);male gender     Objective:    Today's Vitals   08/12/22 0927  BP: 132/76  Pulse: 70  Resp: 16  SpO2: 93%  Weight: 203 lb 9.6 oz (92.4 kg)  Height: '6\' 1"'$  (1.854 m)  PainSc: 0-No pain   Body mass index is 26.86 kg/m.     08/12/2022    9:24 AM 03/17/2017    9:52 AM  Advanced Directives  Does Patient Have a Medical Advance Directive? No No    Current Medications (verified) Outpatient Encounter Medications as of 08/12/2022  Medication Sig   pantoprazole (PROTONIX) 40 MG tablet Take 1 tablet (40 mg total) by mouth daily.   nitroGLYCERIN (NITROSTAT) 0.4 MG SL tablet Place 1 tablet (0.4 mg total) under the tongue every 5 (five) minutes as needed.   rosuvastatin (CRESTOR) 20 MG tablet Take 1 tablet (20 mg total) by mouth daily.   No facility-administered encounter medications on file as of 08/12/2022.    Allergies (verified) Patient has no known allergies.   History: Past Medical History:  Diagnosis Date   Acute gastric ulcer with hemorrhage 06/24/2020   Arthritis    right ankle   Closed fracture of upper end of left fibula 09/23/2020   Closed nondisplaced fracture of body of right scapula 09/23/2020   Closed nondisplaced fracture of third metatarsal bone of left foot 09/23/2020   Gastroesophageal reflux disease 06/24/2020   Mixed conductive and sensorineural hearing loss, bilateral  06/24/2020   Mixed hyperlipidemia 06/24/2020   Past Surgical History:  Procedure Laterality Date   FRACTURE SURGERY     right ankle surgery in [place]   meniscal tears.   SKIN CANCER EXCISION Left 05/25/2020   Family History  Problem Relation Age of Onset   Febrile seizures Mother    Stomach cancer Father    Heart Problems Brother    Heart Problems Maternal Uncle    Birth defects Maternal Uncle    Learning disabilities Maternal Grandmother    Social History   Socioeconomic History   Marital status: Married    Spouse name: Twilla   Number of children: Not on file   Years of education: Not on file   Highest education level: Not on file  Occupational History   Occupation: Retired  Tobacco Use   Smoking status: Former    Packs/day: 1.50    Years: 48.00    Total pack years: 72.00    Types: Cigarettes    Quit date: 05/20/2020    Years since quitting: 2.2   Smokeless tobacco: Never  Vaping Use   Vaping Use: Never used  Substance and Sexual Activity   Alcohol use: Not Currently   Drug use: No   Sexual activity: Yes    Partners: Female  Other Topics Concern   Not on file  Social History Narrative   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  Social Determinants of Health   Financial Resource Strain: Low Risk  (08/12/2022)   Overall Financial Resource Strain (CARDIA)    Difficulty of Paying Living Expenses: Not hard at all  Food Insecurity: No Food Insecurity (08/12/2022)   Hunger Vital Sign    Worried About Running Out of Food in the Last Year: Never true    Ran Out of Food in the Last Year: Never true  Transportation Needs: No Transportation Needs (08/12/2022)   PRAPARE - Hydrologist (Medical): No    Lack of Transportation (Non-Medical): No  Physical Activity: Sufficiently Active (08/12/2022)   Exercise Vital Sign    Days of Exercise per Week: 4 days    Minutes of Exercise per Session: 60 min   Stress: No Stress Concern Present (08/12/2022)   [place]    Feeling of Stress : Not at all  Social Connections: Moderately Integrated (08/12/2022)   Social Connection and Isolation Panel [NHANES]    Frequency of Communication with Friends and Family: More than three times a week    Frequency of Social Gatherings with Friends and Family: More than three times a week    Attends Religious Services: More than 4 times per year    Active Member of [employer] or Organizations: No    Attends Music therapist: Not on file    Marital Status: Married    Tobacco Counseling Counseling given: Patient does not use tobacco products   Clinical Intake:  Pre-visit preparation completed: Yes Pain : No/denies pain Pain Score: 0-No pain   Nutritional Risks: None Diabetes: No How often do you need to have someone help you when you read instructions, pamphlets, or other written materials from your doctor or pharmacy?: 1 - Never Interpreter Needed?: No    Activities of Daily Living    08/12/2022    9:24 AM  In your present state of health, do you have any difficulty performing the following activities:  Hearing? 0  Vision? 0  Difficulty concentrating or making decisions? 0  Walking or climbing stairs? 0  Dressing or bathing? 0  Doing errands, shopping? 0  Preparing Food and eating ? N  Using the Toilet? N  In the past six months, have you accidently leaked urine? N  Do you have problems with loss of bowel control? N  Managing your Medications? N  Managing your Finances? N  Housekeeping or managing your Housekeeping? N    Patient Care Team: Rochel Brome, MD as PCP - General (Internal Medicine) Rochel Brome, MD as Referring Physician (Internal Medicine) Maryjane Hurter, MD as Consulting Physician (Pulmonary Disease)     Assessment:   This is a routine wellness examination for Adrian Allen.  Hearing/Vision  screen No results found.  Dietary issues and exercise activities discussed: Current Exercise Habits: Home exercise routine, Type of exercise: strength training/weights;stretching, Time (Minutes): 60, Frequency (Times/Week): 4, Weekly Exercise (Minutes/Week): 240, Intensity: Moderate, Exercise limited by: None identified  Depression Screen    08/12/2022    9:22 AM 09/09/2021    8:32 AM 07/02/2021    8:29 AM 07/02/2021    8:07 AM 06/24/2020    8:04 AM  PHQ 2/9 Scores  PHQ - 2 Score 0 0 0 0 0    Fall Risk    08/12/2022    9:24 AM 01/07/2022    8:04 AM 09/09/2021    8:32 AM 07/02/2021    8:30 AM 07/02/2021  8:05 AM  [place] in the past year? 0 0 0 1 1  Number falls in past yr: 0 0 0 0 0  Injury with Fall? 0 0 0 0 1  Risk for fall due to : No Fall Risks No Fall Risks   History of fall(s)  Follow up Falls evaluation completed;Education provided Falls evaluation completed   Falls prevention discussed    FALL RISK PREVENTION PERTAINING TO THE HOME:  Any stairs in or around the home? No  If so, are there any without handrails? No  Home free of loose throw rugs in walkways, pet beds, electrical cords, etc? Yes  Adequate lighting in your home to reduce risk of falls? Yes   ASSISTIVE DEVICES UTILIZED TO PREVENT FALLS:  Life alert? No  Use of a cane, walker or w/c? No  Grab bars in the bathroom? No  Shower chair or bench in shower? No  Elevated toilet seat or a handicapped toilet? No   Gait steady and fast without use of assistive device  Cognitive Function:        08/12/2022    9:25 AM  6CIT Screen  What Year? 0 points  What month? 0 points  What time? 0 points  Count back from 20 0 points  Months in reverse 0 points  Repeat phrase 0 points  Total Score 0 points    Immunizations Immunization History  Administered Date(s) Administered   Fluad Quad(high Dose 65+) 09/12/2020, 09/07/2021   Moderna SARS-COV2 Booster Vaccination 11/04/2020   Moderna Sars-Covid-2  Vaccination 02/01/2020, 02/27/2020   PNEUMOCOCCAL CONJUGATE-20 01/07/2022   Pneumococcal Polysaccharide-23 05/06/2016, 06/20/2019    TDAP status: Due, Education has been provided regarding the importance of this vaccine. Advised may receive this vaccine at local pharmacy or Health Dept. Aware to provide a copy of the vaccination record if obtained from local pharmacy or Health Dept. Verbalized acceptance and understanding.  Flu Vaccine status: Due, Education has been provided regarding the importance of this vaccine. Advised may receive this vaccine at local pharmacy or Health Dept. Aware to provide a copy of the vaccination record if obtained from local pharmacy or Health Dept. Verbalized acceptance and understanding.  Pneumococcal vaccine status: Up to date  Covid-19 vaccine status: Information provided on how to obtain vaccines.   Qualifies for Shingles Vaccine? Yes   Zostavax completed No   Shingrix Completed?: No.    Education has been provided regarding the importance of this vaccine. Patient has been advised to call insurance company to determine out of pocket expense if they have not yet received this vaccine. Advised may also receive vaccine at local pharmacy or Health Dept. Verbalized acceptance and understanding.  Screening Tests Health Maintenance  Topic Date Due   TETANUS/TDAP  Never done   Zoster Vaccines- Shingrix (1 of 2) Never done   COVID-19 Vaccine (3 - Moderna series) 12/30/2020   INFLUENZA VACCINE  07/20/2022   COLONOSCOPY (Pts 45-51yr Insurance coverage will need to be confirmed)  12/28/2026   Pneumonia Vaccine 71 Years old  Completed   Hepatitis C Screening  Completed   HPV VACCINES  Aged Out    Health Maintenance  Health Maintenance Due  Topic Date Due   TETANUS/TDAP  Never done   Zoster Vaccines- Shingrix (1 of 2) Never done   COVID-19 Vaccine (3 - Moderna series) 12/30/2020   INFLUENZA VACCINE  07/20/2022    Colorectal cancer screening: Type of  screening: Colonoscopy. Completed 12/2016. Repeat every 10 years  Lung Cancer Screening: (Low Dose CT Chest recommended if Age 33-80 years, 30 pack-year currently smoking OR have quit w/in 15years.) does not qualify.   Additional Screening:  Vision Screening: Recommended annual ophthalmology exams for early detection of glaucoma and other disorders of the eye. Is the patient up to date with their annual eye exam?  Yes   Dental Screening: Recommended annual dental exams for proper oral hygiene     Plan:    1- Shingrix and Tetanus vaccine recommended to get at pharmacy 2- Flu Vaccine recommended - will get in October  I have personally reviewed and noted the following in the patient's chart:   Medical and social history Use of alcohol, tobacco or illicit drugs  Current medications and supplements including opioid prescriptions. Patient is not currently taking opioid prescriptions. Functional ability and status Nutritional status Physical activity Advanced directives List of other physicians Hospitalizations, surgeries, and ER visits in previous 12 months Vitals Screenings to include cognitive, depression, and falls Referrals and appointments  In addition, I have reviewed and discussed with patient certain preventive protocols, quality metrics, and best practice recommendations. A written personalized care plan for preventive services as well as general preventive health recommendations were provided to patient.     Erie Noe, LPN   2/64/1583

## 2022-08-13 NOTE — Patient Instructions (Signed)
Mr. Adrian Allen , Thank you for taking time to come for your Medicare Wellness Visit. I appreciate your ongoing commitment to your health goals. Please review the following plan we discussed and let me know if I can assist you in the future.   Screening recommendations/referrals: Colonoscopy: Due 2028 Recommended yearly ophthalmology/optometry visit for glaucoma screening and checkup Recommended yearly dental visit for hygiene and checkup  Vaccinations: Influenza vaccine: Due Fall 2023 Pneumococcal vaccine: Complete Tdap vaccine: Due - can get at pharmacy Shingles vaccine: Due - can get at pharmacy    Advanced directives: Please bring a copy for your medical record once complete   Preventive Care 65 Years and Older, Male Preventive care refers to lifestyle choices and visits with your health care provider that can promote health and wellness. What does preventive care include? A yearly physical exam. This is also called an annual well check. Dental exams once or twice a year. Routine eye exams. Ask your health care provider how often you should have your eyes checked. Personal lifestyle choices, including: Daily care of your teeth and gums. Regular physical activity. Eating a healthy diet. Avoiding tobacco and drug use. Limiting alcohol use. Practicing safe sex. Taking low doses of aspirin every day. Taking vitamin and mineral supplements as recommended by your health care provider. What happens during an annual well check? The services and screenings done by your health care provider during your annual well check will depend on your age, overall health, lifestyle risk factors, and family history of disease. Counseling  Your health care provider may ask you questions about your: Alcohol use. Tobacco use. Drug use. Emotional well-being. Home and relationship well-being. Sexual activity. Eating habits. History of falls. Memory and ability to understand (cognition). Work and work  Statistician. Screening  You may have the following tests or measurements: Height, weight, and BMI. Blood pressure. Lipid and cholesterol levels. These may be checked every 5 years, or more frequently if you are over 57 years old. Skin check. Lung cancer screening. You may have this screening every year starting at age 58 if you have a 30-pack-year history of smoking and currently smoke or have quit within the past 15 years. Fecal occult blood test (FOBT) of the stool. You may have this test every year starting at age 31. Flexible sigmoidoscopy or colonoscopy. You may have a sigmoidoscopy every 5 years or a colonoscopy every 10 years starting at age 77. Prostate cancer screening. Recommendations will vary depending on your family history and other risks. Hepatitis C blood test. Hepatitis B blood test. Sexually transmitted disease (STD) testing. Diabetes screening. This is done by checking your blood sugar (glucose) after you have not eaten for a while (fasting). You may have this done every 1-3 years. Abdominal aortic aneurysm (AAA) screening. You may need this if you are a current or former smoker. Osteoporosis. You may be screened starting at age 63 if you are at high risk. Talk with your health care provider about your test results, treatment options, and if necessary, the need for more tests. Vaccines  Your health care provider may recommend certain vaccines, such as: Influenza vaccine. This is recommended every year. Tetanus, diphtheria, and acellular pertussis (Tdap, Td) vaccine. You may need a Td booster every 10 years. Zoster vaccine. You may need this after age 3. Pneumococcal 13-valent conjugate (PCV13) vaccine. One dose is recommended after age 13. Pneumococcal polysaccharide (PPSV23) vaccine. One dose is recommended after age 87. Talk to your health care provider about which screenings  and vaccines you need and how often you need them. This information is not intended to replace  advice given to you by your health care provider. Make sure you discuss any questions you have with your health care provider. Document Released: 01/02/2016 Document Revised: 08/25/2016 Document Reviewed: 10/07/2015 Elsevier Interactive Patient Education  2017 [employer] Prevention in the Home Falls can cause injuries. They can happen to people of all ages. There are many things you can do to make your home safe and to help prevent falls. What can I do on the outside of my home? Regularly fix the edges of walkways and driveways and fix any cracks. Remove anything that might make you trip as you walk through a door, such as a raised step or threshold. Trim any bushes or trees on the path to your home. Use bright outdoor lighting. Clear any walking paths of anything that might make someone trip, such as rocks or tools. Regularly check to see if handrails are loose or broken. Make sure that both sides of any steps have handrails. Any raised decks and porches should have guardrails on the edges. Have any leaves, snow, or ice cleared regularly. Use sand or salt on walking paths during winter. Clean up any spills in your garage right away. This includes oil or grease spills. What can I do in the bathroom? Use night lights. Install grab bars by the toilet and in the tub and shower. Do not use towel bars as grab bars. Use non-skid mats or decals in the tub or shower. If you need to sit down in the shower, use a plastic, non-slip stool. Keep the floor dry. Clean up any water that spills on the floor as soon as it happens. Remove soap buildup in the tub or shower regularly. Attach bath mats securely with double-sided non-slip rug tape. Do not have throw rugs and other things on the floor that can make you trip. What can I do in the bedroom? Use night lights. Make sure that you have a light by your bed that is easy to reach. Do not use any sheets or blankets that are too big for your bed.  They should not hang down onto the floor. Have a firm chair that has side arms. You can use this for support while you get dressed. Do not have throw rugs and other things on the floor that can make you trip. What can I do in the kitchen? Clean up any spills right away. Avoid walking on wet floors. Keep items that you use a lot in easy-to-reach places. If you need to reach something above you, use a strong step stool that has a grab bar. Keep electrical cords out of the way. Do not use floor polish or wax that makes floors slippery. If you must use wax, use non-skid floor wax. Do not have throw rugs and other things on the floor that can make you trip. What can I do with my stairs? Do not leave any items on the stairs. Make sure that there are handrails on both sides of the stairs and use them. Fix handrails that are broken or loose. Make sure that handrails are as long as the stairways. Check any carpeting to make sure that it is firmly attached to the stairs. Fix any carpet that is loose or worn. Avoid having throw rugs at the top or bottom of the stairs. If you do have throw rugs, attach them to the floor with carpet tape.  Make sure that you have a light switch at the top of the stairs and the bottom of the stairs. If you do not have them, ask someone to add them for you. What else can I do to help prevent falls? Wear shoes that: Do not have high heels. Have rubber bottoms. Are comfortable and fit you well. Are closed at the toe. Do not wear sandals. If you use a stepladder: Make sure that it is fully opened. Do not climb a closed stepladder. Make sure that both sides of the stepladder are locked into place. Ask someone to hold it for you, if possible. Clearly mark and make sure that you can see: Any grab bars or handrails. First and last steps. Where the edge of each step is. Use tools that help you move around (mobility aids) if they are needed. These  include: Canes. Walkers. Scooters. Crutches. Turn on the lights when you go into a dark area. Replace any light bulbs as soon as they burn out. Set up your furniture so you have a clear path. Avoid moving your furniture around. If any of your floors are uneven, fix them. If there are any pets around you, be aware of where they are. Review your medicines with your doctor. Some medicines can make you feel dizzy. This can increase your chance of falling. Ask your doctor what other things that you can do to help prevent falls. This information is not intended to replace advice given to you by your health care provider. Make sure you discuss any questions you have with your health care provider. Document Released: 10/02/2009 Document Revised: 05/13/2016 Document Reviewed: 01/10/2015 Elsevier Interactive Patient Education  2017 [employer].

## 2022-09-17 ENCOUNTER — Ambulatory Visit (INDEPENDENT_AMBULATORY_CARE_PROVIDER_SITE_OTHER): Payer: Medicare Other

## 2022-09-17 DIAGNOSIS — Z23 Encounter for immunization: Secondary | ICD-10-CM | POA: Diagnosis not present

## 2022-10-05 DIAGNOSIS — C44329 Squamous cell carcinoma of skin of other parts of face: Secondary | ICD-10-CM | POA: Diagnosis not present

## 2022-10-05 DIAGNOSIS — D485 Neoplasm of uncertain behavior of skin: Secondary | ICD-10-CM | POA: Diagnosis not present

## 2022-10-05 DIAGNOSIS — C44629 Squamous cell carcinoma of skin of left upper limb, including shoulder: Secondary | ICD-10-CM | POA: Diagnosis not present

## 2022-10-05 DIAGNOSIS — Z85828 Personal history of other malignant neoplasm of skin: Secondary | ICD-10-CM | POA: Diagnosis not present

## 2022-10-05 DIAGNOSIS — D692 Other nonthrombocytopenic purpura: Secondary | ICD-10-CM | POA: Diagnosis not present

## 2022-10-05 DIAGNOSIS — L570 Actinic keratosis: Secondary | ICD-10-CM | POA: Diagnosis not present

## 2022-10-05 DIAGNOSIS — L821 Other seborrheic keratosis: Secondary | ICD-10-CM | POA: Diagnosis not present

## 2022-10-05 DIAGNOSIS — D0462 Carcinoma in situ of skin of left upper limb, including shoulder: Secondary | ICD-10-CM | POA: Diagnosis not present

## 2022-11-18 DIAGNOSIS — H2513 Age-related nuclear cataract, bilateral: Secondary | ICD-10-CM | POA: Diagnosis not present

## 2022-11-24 DIAGNOSIS — C44329 Squamous cell carcinoma of skin of other parts of face: Secondary | ICD-10-CM | POA: Diagnosis not present

## 2022-12-07 IMAGING — CT CT HEART MORP W/ CTA COR W/ SCORE W/ CA W/CM &/OR W/O CM
4 of 7 series · 8 of 20 positions shown, 9 images · IV contrast (APPLIED)
Comparison: None.

Addendum:
CLINICAL DATA: Chest pain

EXAM:
Cardiac CTA
MEDICATIONS:
Sub lingual nitro. 4mg and lopressor 25mg
TECHNIQUE: The patient was scanned on a Siemens Force [REDACTED]ice scanner. Gantry
rotation speed was 250 msecs. Collimation was .6 mm. A 100 kV
prospective scan was triggered in the ascending thoracic aorta at
140 HU's Full mA was used between 35% and 75% of the R-R interval.
Average HR during the scan was 58 bpm. The 3D data set was
interpreted on a dedicated work station using MPR, MIP and VRT
modes. A total of 80cc of contrast was used.

[Series 6: ts diast sharp · axial · 0.42mm/px · z∈[-146,-111]mm · 2 of 266 slices shown]
[im 89/266  lung]
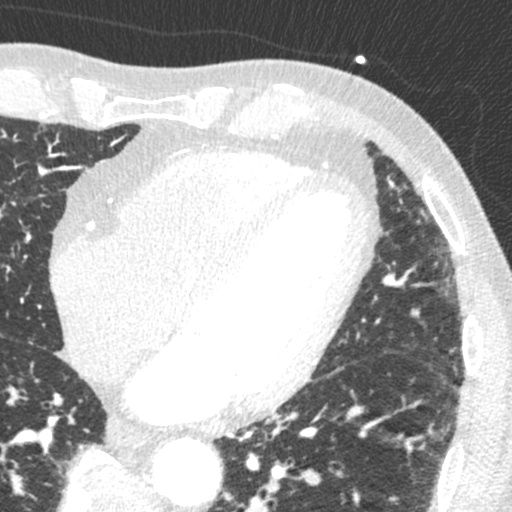
[im 177/266  lung]
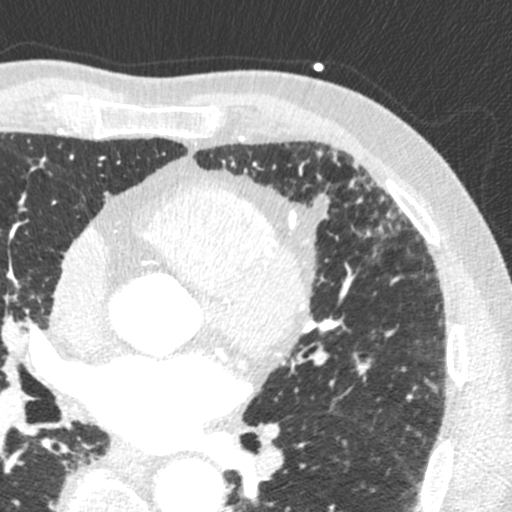

[Series 7: ts syst sharp · axial · 0.42mm/px · z∈[-146,-111]mm · 2 of 266 slices shown]
[im 89/266  lung]
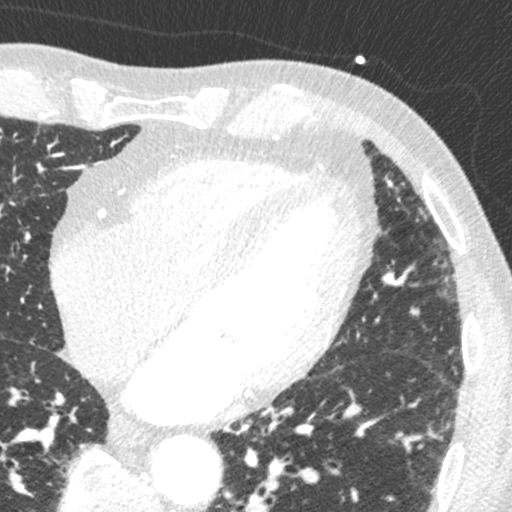
[im 177/266  lung]
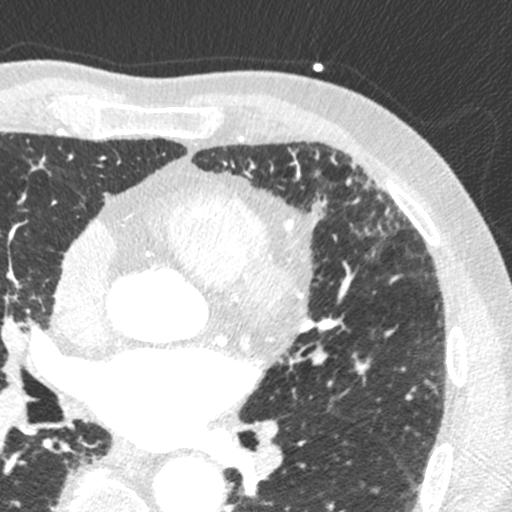

[Series 8: best diast · axial · 0.42mm/px · z∈[-146,-111]mm · 2 of 266 slices shown, 3 images]
[im 89/266  vessel]
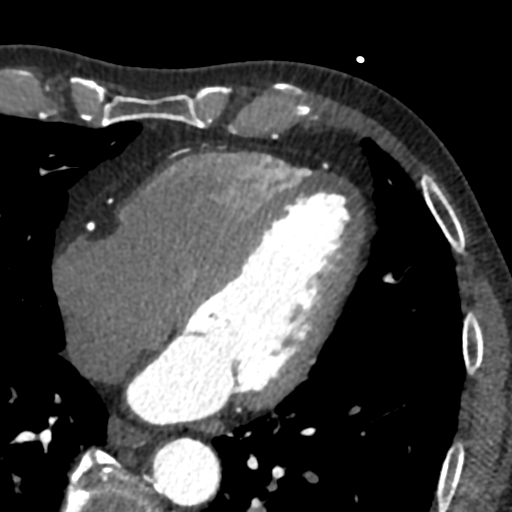
[im 89/266  lung]
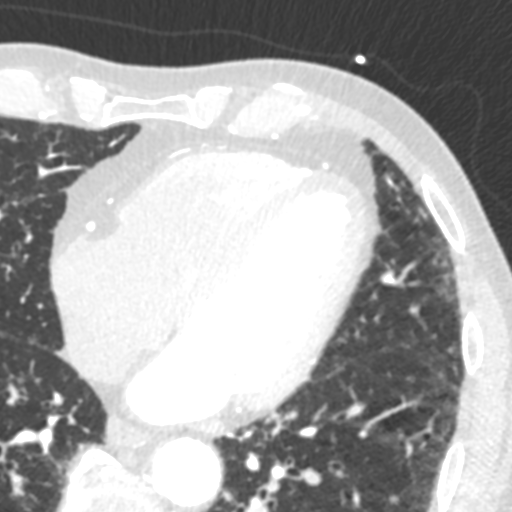
[im 177/266  vessel]
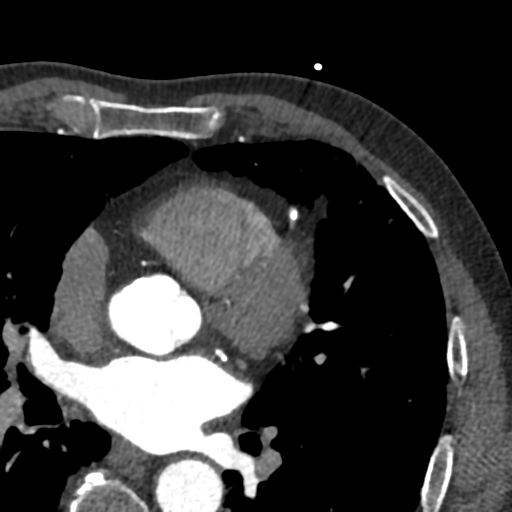

[Series 9: best syst · axial · 0.42mm/px · z∈[-146,-111]mm · 2 of 266 slices shown]
[im 89/266  vessel]
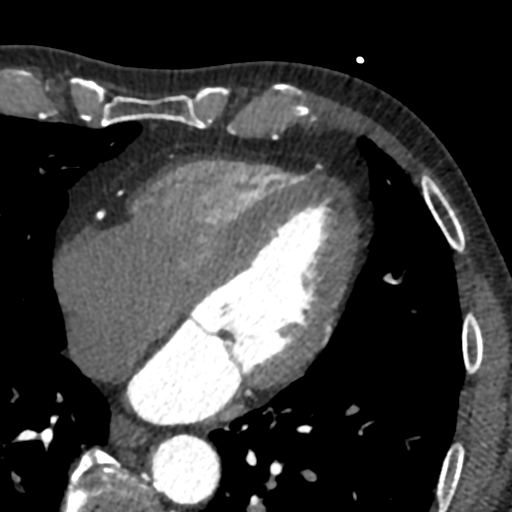
[im 177/266  vessel]
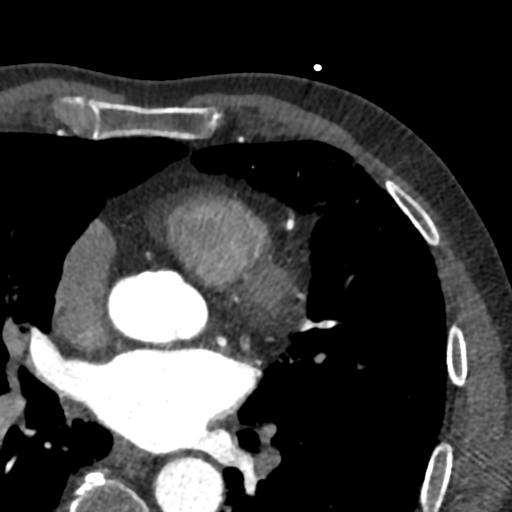

[8 of 20 positions shown; findings below may reference images not displayed]

FINDINGS: Non-cardiac: See separate report from [REDACTED]. No
significant findings on limited lung and soft tissue windows.

Calcium Score: LM and 3 vessel calcium noted

LM:

LAD: 473

LCX: 114

RCA:

Total 613 which is 79 th percentile for age/sex

Coronary Arteries: Right dominant with no anomalies

LM: 1-24% calcified plaque

LAD: Dense area of proximal calcification 50-69% stenosis 25-49%
calcified distal stenosis 1-24% calcified mid stenosis

D1: Normal ostium may be involved with plaque from proximal LAD

Circumflex: 25-49% calcified plaque proximally

OM1: Normal

AV Groove: 25-49% calcified plaque

RCA: 1-24% calcified plaque proximally 1-24% soft plaque mid

PDA: 25-49% calcified plaque proximally

PLA: Normal
IMPRESSION: 1. Calcium score 613 which is 79 th percentile for age/sex

2. CAD RADS 3 50-69% calcified plaque in proximal LAD Study sent for
FFR CT

3.  Normal ascending thoracic aorta 3.5 cm

Mpaly Grau

EXAM:
OVER-READ INTERPRETATION  CT CHEST

The following report is an over-read performed by radiologist Dr.
over-read does not include interpretation of cardiac or coronary
anatomy or pathology. The coronary calcium score and cardiac CTA
interpretation by the cardiologist is attached.
FINDINGS: Atherosclerotic calcifications are noted in the thoracic aorta.
Within the visualize lungs there are widespread areas of septal
thickening, subpleural reticulation, mild cylindrical bronchiectasis
and peripheral bronchiolectasis. Within the visualized portions of
the thorax there are no suspicious appearing pulmonary nodules or
masses, there is no acute consolidative airspace disease, no pleural
effusions, no pneumothorax and no lymphadenopathy. Visualized
portions of the upper abdomen are unremarkable. There are no
aggressive appearing lytic or blastic lesions noted in the
visualized portions of the skeleton.
IMPRESSION: 1. The appearance the chest is compatible with interstitial lung
disease, previously characterized as probable chronic
hypersensitivity pneumonitis. This is incompletely evaluated on
today's cardiac CT examination. Outpatient referral to Pulmonology
for further clinical evaluation and management is recommended.
2.  Aortic Atherosclerosis ([id number]).

*** End of Addendum ***
FINDINGS: Non-cardiac: See separate report from [REDACTED]. No
significant findings on limited lung and soft tissue windows.

Calcium Score: LM and 3 vessel calcium noted

LM:

LAD: 473

LCX: 114

RCA:

Total 613 which is 79 th percentile for age/sex

Coronary Arteries: Right dominant with no anomalies

LM: 1-24% calcified plaque

LAD: Dense area of proximal calcification 50-69% stenosis 25-49%
calcified distal stenosis 1-24% calcified mid stenosis

D1: Normal ostium may be involved with plaque from proximal LAD

Circumflex: 25-49% calcified plaque proximally

OM1: Normal

AV Groove: 25-49% calcified plaque

RCA: 1-24% calcified plaque proximally 1-24% soft plaque mid

PDA: 25-49% calcified plaque proximally

PLA: Normal
IMPRESSION: 1. Calcium score 613 which is 79 th percentile for age/sex

2. CAD RADS 3 50-69% calcified plaque in proximal LAD Study sent for
FFR CT

3.  Normal ascending thoracic aorta 3.5 cm

Mpaly Grau

## 2023-01-01 NOTE — Progress Notes (Signed)
Synopsis: Referred for emphysema by Rochel Brome, MD  Subjective:   PATIENT ID: Adrian Allen GENDER: male DOB: October 17, 1951, MRN: [id number]  Chief Complaint  Patient presents with   Follow-up    Breathing is doing well. He has occ dry cough.    72yM with history of GERD takes famotidine as needed, bronchiectasis, possible progressive chronic HP  He has no DOE relative to peers without lung disease. He has a cough but it's not particularly bothersome to him. Bothers his wife. No dysphagia to solids/liquids, rashes, ulcers in mouth or nose, raynaud's phenomenon.  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 [place], [place] then [place]. No vaping. MJ when much younger. No hot tub/sauna, pet birds, feather pillows, down comforters.   Interval HPI:  No DOE limiting his activities. Some cough which he minimizes. Sounds like ppi may have helped some.   Regarding his history of smoking, we planned to discuss screening for lung cancer.   Otherwise pertinent review of systems is negative.  Past Medical History:  Diagnosis Date   Acute gastric ulcer with hemorrhage 06/24/2020   Arthritis    right ankle   Closed fracture of upper end of left fibula 09/23/2020   Closed nondisplaced fracture of body of right scapula 09/23/2020   Closed nondisplaced fracture of third metatarsal bone of left foot 09/23/2020   Gastroesophageal reflux disease 06/24/2020   Mixed conductive and sensorineural hearing loss, bilateral 06/24/2020   Mixed hyperlipidemia 06/24/2020     Family History  Problem Relation Age of Onset   Febrile seizures Mother    Stomach cancer Father    Heart Problems Brother    Heart Problems Maternal Uncle    Birth defects Maternal Uncle    Learning disabilities Maternal Grandmother      Past Surgical History:  Procedure Laterality Date   FRACTURE  SURGERY     right ankle surgery in [place]  2023   meniscal tears.   SKIN CANCER EXCISION Left 05/25/2020    Social History   Socioeconomic History   Marital status: Married    Spouse name: Twilla   Number of children: Not on file   Years of education: Not on file   Highest education level: Not on file  Occupational History   Occupation: Retired  Tobacco Use   Smoking status: Former    Packs/day: 1.50    Years: 48.00    Total pack years: 72.00    Types: Cigarettes    Quit date: 05/20/2020    Years since quitting: 2.6   Smokeless tobacco: Never  Vaping Use   Vaping Use: Never used  Substance and Sexual Activity   Alcohol use: Not Currently   Drug use: No   Sexual activity: Yes    Partners: Female  Other Topics Concern   Not on file  Social History Narrative   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   Social Determinants of Health   Financial Resource Strain: Low Risk  (08/12/2022)   Overall Financial Resource Strain (CARDIA)    Difficulty of Paying Living Expenses: Not hard at all  Food Insecurity: No Food Insecurity (08/12/2022)   Hunger Vital Sign    Worried About Running Out of Food in the Last Year: Never true    [place] in the Last Year: Never true  Transportation Needs: No Transportation Needs (08/12/2022)   PRAPARE - Hydrologist (Medical): No    Lack of Transportation (Non-Medical): No  Physical Activity: Sufficiently Active (08/12/2022)   Exercise Vital Sign    Days of Exercise per Week: 4 days    Minutes of Exercise per Session: 60 min  Stress: No Stress Concern Present (08/12/2022)   [place]    Feeling of Stress : Not at all  Social Connections: Moderately Integrated (08/12/2022)   Social Connection and Isolation Panel [NHANES]    Frequency of Communication with Friends and Family: More than three times a week     Frequency of Social Gatherings with Friends and Family: More than three times a week    Attends Religious Services: More than 4 times per year    Active Member of [employer] or Organizations: No    Attends Archivist Meetings: Not on file    Marital Status: Married  Human resources officer Violence: Not At Risk (08/12/2022)   Humiliation, Afraid, Rape, and Kick questionnaire    Fear of Current or Ex-Partner: No    Emotionally Abused: No    Physically Abused: No    Sexually Abused: No     No Known Allergies   Outpatient Medications Prior to Visit  Medication Sig Dispense Refill   nitroGLYCERIN (NITROSTAT) 0.4 MG SL tablet Place 1 tablet (0.4 mg total) under the tongue every 5 (five) minutes as needed. 25 tablet 6   pantoprazole (PROTONIX) 40 MG tablet Take 1 tablet (40 mg total) by mouth daily. 90 tablet 1   rosuvastatin (CRESTOR) 20 MG tablet Take 1 tablet (20 mg total) by mouth daily. 90 tablet 3   No facility-administered medications prior to visit.       Objective:   Physical Exam:  General appearance: 72 y.o., male, NAD, conversant  Eyes: anicteric sclerae; PERRL, tracking appropriately HENT: NCAT; MMM Neck: Trachea midline; no lymphadenopathy, no JVD Lungs: faint crackles in bases, with normal respiratory effort CV: RRR, no murmur  Abdomen: Soft, non-tender; non-distended, BS present  Extremities: No peripheral edema, warm Skin: Normal turgor and texture; no rash Psych: Appropriate affect Neuro: Alert and oriented to person and place, no focal deficit     Vitals:   01/05/23 1344  BP: 114/78  Pulse: 70  Temp: 97.9 F (36.6 C)  TempSrc: Oral  SpO2: 97%  Weight: 204 lb (92.5 kg)  Height: '6\' 1"'$  (1.854 m)     97% on RA BMI Readings from Last 3 Encounters:  01/05/23 26.91 kg/m  08/12/22 26.86 kg/m  08/05/22 26.65 kg/m   Wt Readings from Last 3 Encounters:  01/05/23 204 lb (92.5 kg)  08/12/22 203 lb 9.6 oz (92.4 kg)  08/05/22 202 lb (91.6 kg)      CBC    Component Value Date/Time   WBC 7.3 08/05/2022 0803   RBC 4.10 (L) 08/05/2022 0803   HGB 13.0 08/05/2022 0803   HCT 39.1 08/05/2022 0803   PLT 146 (L) 08/05/2022 0803   MCV 95 08/05/2022 0803   MCH 31.7 08/05/2022 0803   MCHC 33.2 08/05/2022 0803   RDW 11.7 08/05/2022 0803   LYMPHSABS 2.1 08/05/2022 0803   EOSABS 0.2 08/05/2022 0803   BASOSABS 0.1 08/05/2022 0803    Chest Imaging: HRCT Chest 06/03/22 reviewed by me pretty stable relative to LDCT Chest 2022, no LAD  CT Chest 03/09/22 reviewed by me grossly stable extent of fibrotic  lung disease in bases  LDCT Chest 2022 reviewed by me with bronchial wall thickening, traction, subplueral reticulation, early honeycomb change - reportedly progressive since 2021  Pulmonary Functions Testing Results:    Latest Ref Rng & Units 07/13/2022    3:45 PM  PFT Results  FVC-Pre L 4.05   FVC-Predicted Pre % 81   FVC-Post L 4.18   FVC-Predicted Post % 83   Pre FEV1/FVC % % 78   Post FEV1/FCV % % 78   FEV1-Pre L 3.14   FEV1-Predicted Pre % 85   FEV1-Post L 3.25   DLCO uncorrected ml/min/mmHg 15.54   DLCO UNC% % 54   DLCO corrected ml/min/mmHg 15.54   DLCO COR %Predicted % 54   DLVA Predicted % 64   TLC L 6.66   TLC % Predicted % 87   RV % Predicted % 96    PFT 07/13/22 reviewed by me with moderately reduced diffusing capacity  TTE 05/18/22:  1. Left ventricular ejection fraction, by estimation, is 60 to 65%. The  left ventricle has normal function. The left ventricle has no regional  wall motion abnormalities. Left ventricular diastolic parameters are  consistent with Grade I diastolic  dysfunction (impaired relaxation).   2. Right ventricular systolic function is normal. The right ventricular  size is normal. There is normal pulmonary artery systolic pressure.   3. The mitral valve is normal in structure. Trivial mitral valve  regurgitation. No evidence of mitral stenosis.   4. The aortic valve is normal in  structure. Aortic valve regurgitation is  not visualized. No aortic stenosis is present.   5. The inferior vena cava is normal in size with greater than 50%  respiratory variability, suggesting right atrial pressure of 3 mmHg.     Assessment & Plan:   # ILD Subpleural predominant reticular opacities and fibrosis, traction bronchiectasis that probably appears progressive - to me it looks like probable UIP pattern. Unclear etiology - metal-working related HP, metal dust pneumoconiosis but not a lot of LAD, IPF possibilities. Low suspicion for CTD ILD.   # Chronic cough GERD vs ILD - not a ton of improvement in cough with ppi.  Plan: - ok to stop pantoprazole  - declines testing for CTD serologies. We also talked about antifibrotics in the case that this instead represents IPF or other progressive fibrotic lung disease. Weighing his limited symptom burden, high risk of adverse effects leading to cessation against benefit of AE-IPF prevention and delayed lung function decline he is not currently interested in starting one of these medications. - CT Chest in June for lung cancer screening, appointment afterward - PFTs at next appointment - see you in 6 months!    Maryjane Hurter, MD [facility] 01/05/2023 4:55 PM

## 2023-01-05 ENCOUNTER — Encounter: Payer: Self-pay | Admitting: Student

## 2023-01-05 ENCOUNTER — Ambulatory Visit: Payer: Medicare Other | Admitting: Student

## 2023-01-05 VITALS — BP 114/78 | HR 70 | Temp 97.9°F | Ht 73.0 in | Wt 204.0 lb

## 2023-01-05 DIAGNOSIS — L821 Other seborrheic keratosis: Secondary | ICD-10-CM | POA: Diagnosis not present

## 2023-01-05 DIAGNOSIS — F17200 Nicotine dependence, unspecified, uncomplicated: Secondary | ICD-10-CM | POA: Diagnosis not present

## 2023-01-05 DIAGNOSIS — J849 Interstitial pulmonary disease, unspecified: Secondary | ICD-10-CM | POA: Diagnosis not present

## 2023-01-05 DIAGNOSIS — L578 Other skin changes due to chronic exposure to nonionizing radiation: Secondary | ICD-10-CM | POA: Diagnosis not present

## 2023-01-05 DIAGNOSIS — Z85828 Personal history of other malignant neoplasm of skin: Secondary | ICD-10-CM | POA: Diagnosis not present

## 2023-01-05 DIAGNOSIS — L570 Actinic keratosis: Secondary | ICD-10-CM | POA: Diagnosis not present

## 2023-01-05 NOTE — Patient Instructions (Addendum)
-  CT Chest in June for lung cancer screening, appointment afterward - PFTs at next appointment

## 2023-01-06 ENCOUNTER — Ambulatory Visit: Payer: Medicare Other | Admitting: Student

## 2023-02-06 NOTE — Assessment & Plan Note (Signed)
Stable. 

## 2023-02-06 NOTE — Assessment & Plan Note (Signed)
Well controlled.  No changes to medicines. Rosuvastatin 20 mg daily Continue to work on eating a healthy diet and exercise.  Labs drawn today.

## 2023-02-06 NOTE — Assessment & Plan Note (Signed)
The current medical regimen is effective;  continue present plan and medications. Pantoprazole 40 mg daily.

## 2023-02-06 NOTE — Progress Notes (Unsigned)
Subjective:  Patient ID: Adrian Allen, male    DOB: 1951/01/26  Age: 72 y.o. MRN: [id number]  No chief complaint on file.   HPI   Hyperlipidemia:  Crestor 20 mg daily for the last couple weeks.   GERD: Pantoprazole 40 mg daily.      08/12/2022    9:22 AM 09/09/2021    8:32 AM 07/02/2021    8:29 AM 07/02/2021    8:07 AM 06/24/2020    8:04 AM  Depression screen PHQ 2/9  Decreased Interest 0 0 0 0 0  Down, Depressed, Hopeless 0 0 0 0 0  PHQ - 2 Score 0 0 0 0 0         07/02/2021    8:05 AM 07/02/2021    8:30 AM 09/09/2021    8:32 AM 01/07/2022    8:04 AM 08/12/2022    9:24 AM  Fall Risk  Falls in the past year? 1 1 0 0 0  Was there an injury with Fall? 1 0 0 0 0  Fall Risk Category Calculator 2 1 0 0 0  Fall Risk Category (Retired) Moderate Low Low Low Low  (RETIRED) Patient Fall Risk Level Moderate fall risk  Low fall risk Low fall risk Low fall risk  Patient at Risk for Falls Due to History of fall(s)   No Fall Risks No Fall Risks  Fall risk Follow up Falls prevention discussed   Falls evaluation completed Falls evaluation completed;Education provided      Review of Systems  Current Outpatient Medications on File Prior to Visit  Medication Sig Dispense Refill   nitroGLYCERIN (NITROSTAT) 0.4 MG SL tablet Place 1 tablet (0.4 mg total) under the tongue every 5 (five) minutes as needed. 25 tablet 6   pantoprazole (PROTONIX) 40 MG tablet Take 1 tablet (40 mg total) by mouth daily. 90 tablet 1   rosuvastatin (CRESTOR) 20 MG tablet Take 1 tablet (20 mg total) by mouth daily. 90 tablet 3   No current facility-administered medications on file prior to visit.   Past Medical History:  Diagnosis Date   Acute gastric ulcer with hemorrhage 06/24/2020   Arthritis    right ankle   Closed fracture of upper end of left fibula 09/23/2020   Closed nondisplaced fracture of body of right scapula 09/23/2020   Closed nondisplaced fracture of third metatarsal bone of left foot 09/23/2020    Gastroesophageal reflux disease 06/24/2020   Mixed conductive and sensorineural hearing loss, bilateral 06/24/2020   Mixed hyperlipidemia 06/24/2020   Past Surgical History:  Procedure Laterality Date   FRACTURE SURGERY     right ankle surgery in [place]  2023   meniscal tears.   SKIN CANCER EXCISION Left 05/25/2020    Family History  Problem Relation Age of Onset   Febrile seizures Mother    Stomach cancer Father    Heart Problems Brother    Heart Problems Maternal Uncle    Birth defects Maternal Uncle    Learning disabilities Maternal Grandmother    Social History   Socioeconomic History   Marital status: Married    Spouse name: Twilla   Number of children: Not on file   Years of education: Not on file   Highest education level: Not on file  Occupational History   Occupation: Retired  Tobacco Use   Smoking status: Former    Packs/day: 1.50    Years: 48.00    Total pack years: 72.00  Types: Cigarettes    Quit date: 05/20/2020    Years since quitting: 2.7   Smokeless tobacco: Never  Vaping Use   Vaping Use: Never used  Substance and Sexual Activity   Alcohol use: Not Currently   Drug use: No   Sexual activity: Yes    Partners: Female  Other Topics Concern   Not on file  Social History Narrative   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   Social Determinants of Health   Financial Resource Strain: Low Risk  (08/12/2022)   Overall Financial Resource Strain (CARDIA)    Difficulty of Paying Living Expenses: Not hard at all  Food Insecurity: No Food Insecurity (08/12/2022)   Hunger Vital Sign    Worried About Running Out of Food in the Last Year: Never true    [place] in the Last Year: Never true  Transportation Needs: No Transportation Needs (08/12/2022)   PRAPARE - Hydrologist (Medical): No    Lack of Transportation (Non-Medical): No  Physical Activity: Sufficiently Active (08/12/2022)    Exercise Vital Sign    Days of Exercise per Week: 4 days    Minutes of Exercise per Session: 60 min  Stress: No Stress Concern Present (08/12/2022)   [place]    Feeling of Stress : Not at all  Social Connections: Moderately Integrated (08/12/2022)   Social Connection and Isolation Panel [NHANES]    Frequency of Communication with Friends and Family: More than three times a week    Frequency of Social Gatherings with Friends and Family: More than three times a week    Attends Religious Services: More than 4 times per year    Active Member of [employer] or Organizations: No    Attends Music therapist: Not on file    Marital Status: Married    Objective:  There were no vitals taken for this visit.     01/05/2023    1:44 PM 08/12/2022    9:27 AM 08/05/2022    7:29 AM  BP/Weight  Systolic BP [id number]  Diastolic BP 78 76 68  Wt. (Lbs) 204 203.6 202  BMI 26.91 kg/m2 26.86 kg/m2 26.65 kg/m2    Physical Exam  Diabetic Foot Exam - Simple   No data filed      Lab Results  Component Value Date   WBC 7.3 08/05/2022   HGB 13.0 08/05/2022   HCT 39.1 08/05/2022   PLT 146 (L) 08/05/2022   GLUCOSE 105 (H) 08/05/2022   CHOL 122 08/05/2022   TRIG 91 08/05/2022   HDL 43 08/05/2022   LDLCALC 61 08/05/2022   ALT 18 08/05/2022   AST 21 08/05/2022   NA 140 08/05/2022   K 4.8 08/05/2022   CL 101 08/05/2022   CREATININE 1.28 (H) 08/05/2022   BUN 14 08/05/2022   CO2 25 08/05/2022   TSH 1.980 06/24/2020   HGBA1C 5.5 01/07/2022      Assessment & Plan:    Gastroesophageal reflux disease without esophagitis Assessment & Plan: The current medical regimen is effective;  continue present plan and medications. Pantoprazole 40 mg daily.   Mixed hyperlipidemia Assessment & Plan: Well controlled.  No changes to medicines. Rosuvastatin 20 mg daily Continue to work on eating a healthy diet and exercise.   Labs drawn today.     Benign prostatic hyperplasia with nocturia Assessment & Plan: Check labs  Mixed conductive and sensorineural hearing loss, bilateral Assessment & Plan: Stable      No orders of the defined types were placed in this encounter.   No orders of the defined types were placed in this encounter.    Follow-up: No follow-ups on file.   I,Marla I Leal-Borjas,acting as a scribe for Rochel Brome, MD.,have documented all relevant documentation on the behalf of Rochel Brome, MD,as directed by  Rochel Brome, MD while in the presence of Rochel Brome, MD.   An After Visit Summary was printed and given to the patient.  Rochel Brome, MD [facility] [phone]

## 2023-02-06 NOTE — Assessment & Plan Note (Signed)
Check labs 

## 2023-02-07 ENCOUNTER — Encounter: Payer: Self-pay | Admitting: Family Medicine

## 2023-02-07 ENCOUNTER — Ambulatory Visit (INDEPENDENT_AMBULATORY_CARE_PROVIDER_SITE_OTHER): Payer: Medicare Other | Admitting: Family Medicine

## 2023-02-07 VITALS — BP 124/70 | HR 81 | Temp 97.2°F | Ht 73.0 in | Wt 203.0 lb

## 2023-02-07 DIAGNOSIS — H906 Mixed conductive and sensorineural hearing loss, bilateral: Secondary | ICD-10-CM

## 2023-02-07 DIAGNOSIS — E782 Mixed hyperlipidemia: Secondary | ICD-10-CM | POA: Diagnosis not present

## 2023-02-07 DIAGNOSIS — N401 Enlarged prostate with lower urinary tract symptoms: Secondary | ICD-10-CM

## 2023-02-07 DIAGNOSIS — K219 Gastro-esophageal reflux disease without esophagitis: Secondary | ICD-10-CM

## 2023-02-07 DIAGNOSIS — I700 Atherosclerosis of aorta: Secondary | ICD-10-CM | POA: Insufficient documentation

## 2023-02-07 DIAGNOSIS — R351 Nocturia: Secondary | ICD-10-CM

## 2023-02-07 NOTE — Assessment & Plan Note (Signed)
Continue crestor 20 mg daily.

## 2023-02-08 LAB — CBC WITH DIFFERENTIAL/PLATELET
Basophils Absolute: 0 10*3/uL (ref 0.0–0.2)
Basos: 1 %
EOS (ABSOLUTE): 0.2 10*3/uL (ref 0.0–0.4)
Eos: 2 %
Hematocrit: 42.4 % (ref 37.5–51.0)
Hemoglobin: 14.5 g/dL (ref 13.0–17.7)
Immature Grans (Abs): 0 10*3/uL (ref 0.0–0.1)
Immature Granulocytes: 1 %
Lymphocytes Absolute: 2 10*3/uL (ref 0.7–3.1)
Lymphs: 26 %
MCH: 32.6 pg (ref 26.6–33.0)
MCHC: 34.2 g/dL (ref 31.5–35.7)
MCV: 95 fL (ref 79–97)
Monocytes Absolute: 0.7 10*3/uL (ref 0.1–0.9)
Monocytes: 10 %
Neutrophils Absolute: 4.6 10*3/uL (ref 1.4–7.0)
Neutrophils: 60 %
Platelets: 158 10*3/uL (ref 150–450)
RBC: 4.45 x10E6/uL (ref 4.14–5.80)
RDW: 11.8 % (ref 11.6–15.4)
WBC: 7.5 10*3/uL (ref 3.4–10.8)

## 2023-02-08 LAB — COMPREHENSIVE METABOLIC PANEL
ALT: 34 IU/L (ref 0–44)
AST: 31 IU/L (ref 0–40)
Albumin/Globulin Ratio: 1.7 (ref 1.2–2.2)
Albumin: 4.3 g/dL (ref 3.9–4.9)
Alkaline Phosphatase: 89 IU/L (ref 44–121)
BUN/Creatinine Ratio: 10 (ref 10–24)
BUN: 15 mg/dL (ref 8–27)
Bilirubin Total: 0.9 mg/dL (ref 0.0–1.2)
CO2: 21 mmol/L (ref 20–29)
Calcium: 9.5 mg/dL (ref 8.6–10.2)
Chloride: 105 mmol/L (ref 96–106)
Creatinine, Ser: 1.46 mg/dL — ABNORMAL HIGH (ref 0.76–1.27)
Globulin, Total: 2.6 g/dL (ref 1.5–4.5)
Glucose: 99 mg/dL (ref 70–99)
Potassium: 4.6 mmol/L (ref 3.5–5.2)
Sodium: 146 mmol/L — ABNORMAL HIGH (ref 134–144)
Total Protein: 6.9 g/dL (ref 6.0–8.5)
eGFR: 51 mL/min/{1.73_m2} — ABNORMAL LOW (ref >59)

## 2023-02-08 LAB — LIPID PANEL
Chol/HDL Ratio: 4.2 ratio (ref 0.0–5.0)
Cholesterol, Total: 189 mg/dL (ref 100–199)
HDL: 45 mg/dL (ref >39)
LDL Chol Calc (NIH): 123 mg/dL — ABNORMAL HIGH (ref 0–99)
Triglycerides: 114 mg/dL (ref 0–149)
VLDL Cholesterol Cal: 21 mg/dL (ref 5–40)

## 2023-02-08 LAB — CARDIOVASCULAR RISK ASSESSMENT

## 2023-02-08 LAB — TSH: TSH: 1.97 u[IU]/mL (ref 0.450–4.500)

## 2023-02-08 NOTE — Progress Notes (Signed)
Blood count normal.  Liver function normal.  Kidney function abnormal. CKD stage 3A.  Thyroid function normal.  Cholesterol: LDL 123. Increase rosuvastatin to 40 mg before bed.  Recommend follow up in 3 months fasting.

## 2023-03-01 ENCOUNTER — Other Ambulatory Visit: Payer: Self-pay

## 2023-03-01 MED ORDER — ROSUVASTATIN CALCIUM 40 MG PO TABS: 40.0000 mg | ORAL_TABLET | Freq: Every day | ORAL | 1 refills | Status: DC

## 2023-03-03 IMAGING — CT CT CHEST HIGH RESOLUTION
1 of 4 series · 14 of 32 positions shown, 18 images · non-contrast
Comparison: Lung cancer screening CT dated September 15, 2021

CLINICAL DATA: Interstitial lung disease



[Series 8: super d · axial · 0.78mm/px · z∈[-338,-42]mm · 14 of 550 slices shown, 18 images]
[im 28/550  mediastinal]
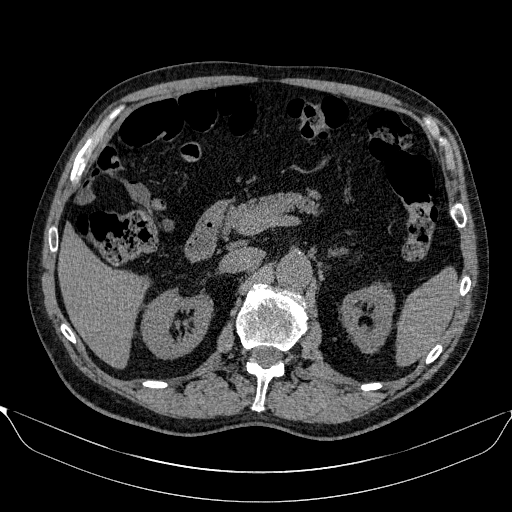
[im 28/550  lung]
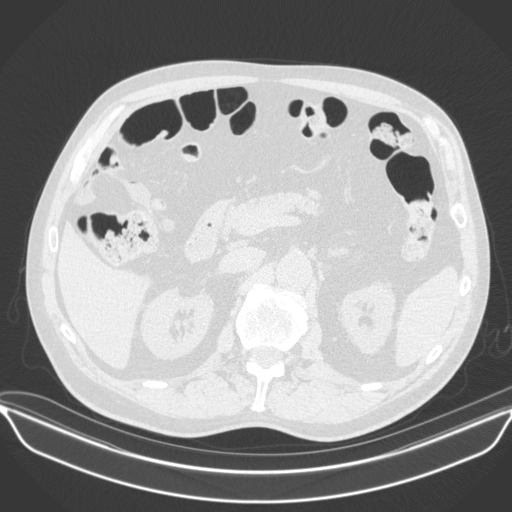
[im 83/550  lung]
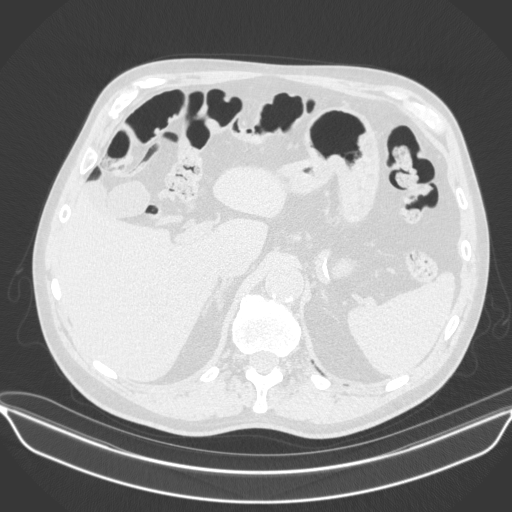
[im 110/550  lung]
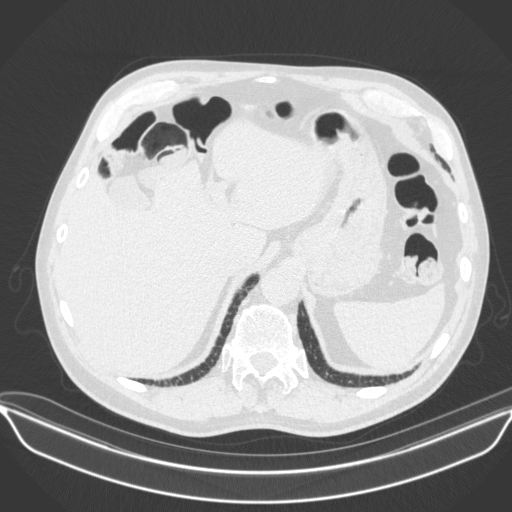
[im 165/550  lung]
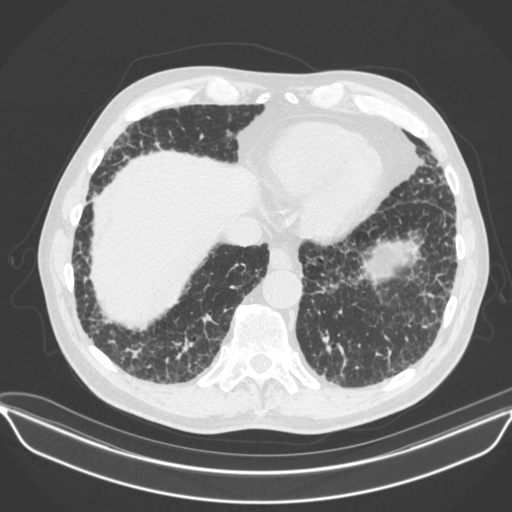
[im 193/550  mediastinal]
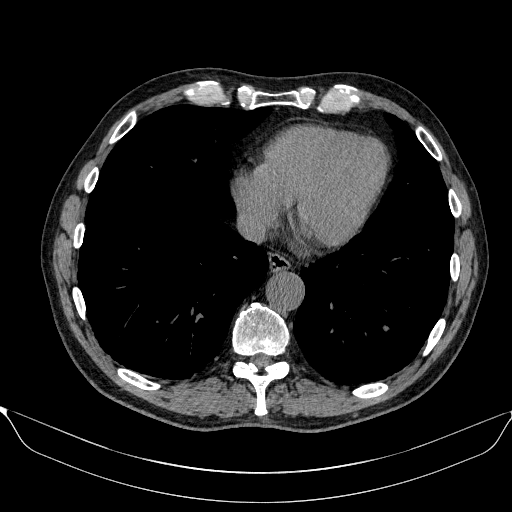
[im 193/550  lung]
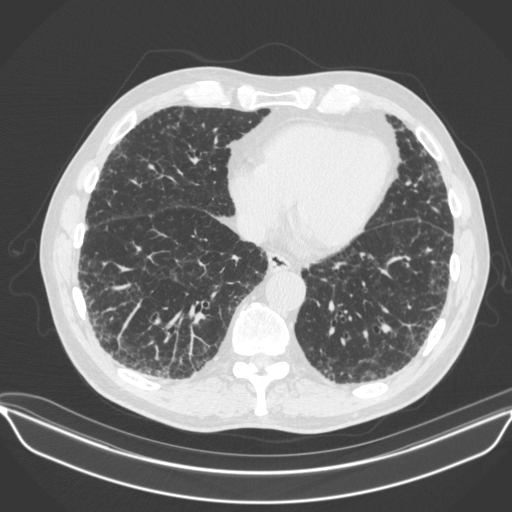
[im 220/550  lung]
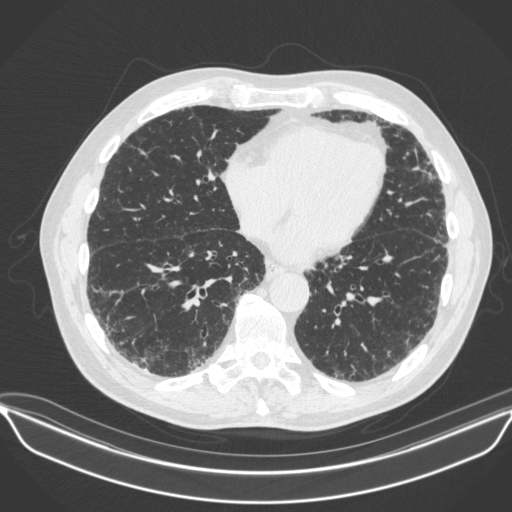
[im 259/550  lung]
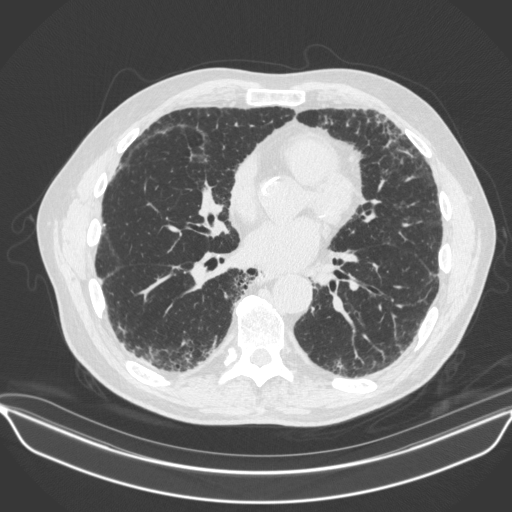
[im 275/550  lung]
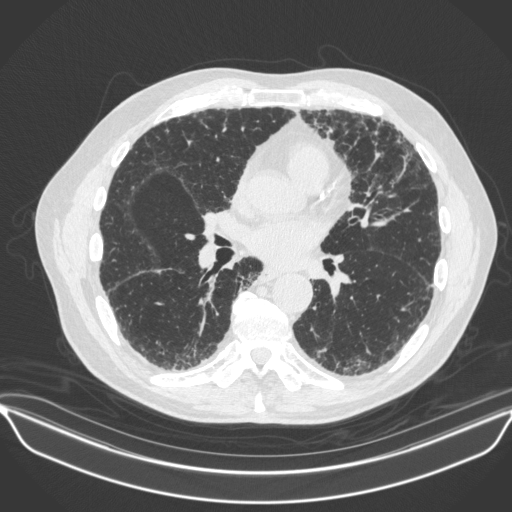
[im 330/550  mediastinal]
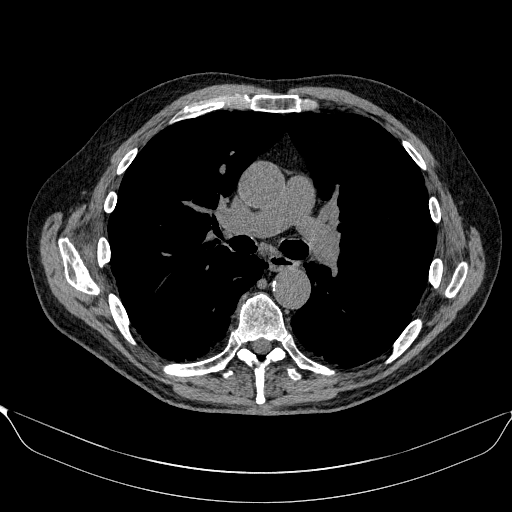
[im 330/550  lung]
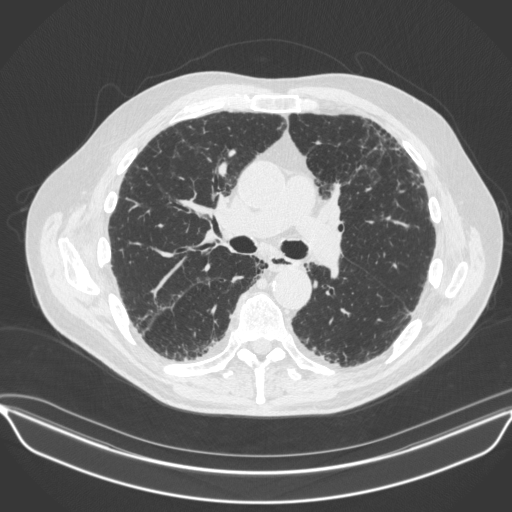
[im 357/550  lung]
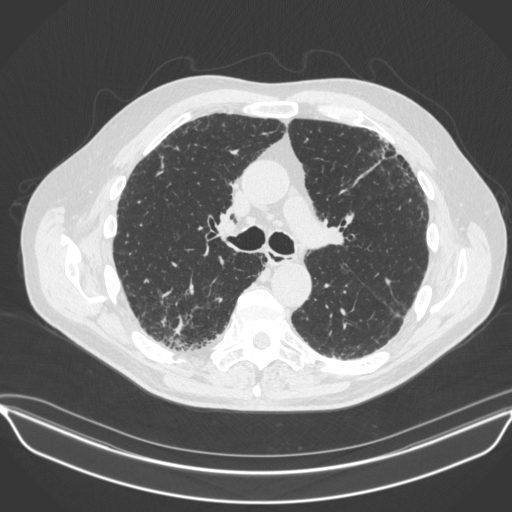
[im 412/550  lung]
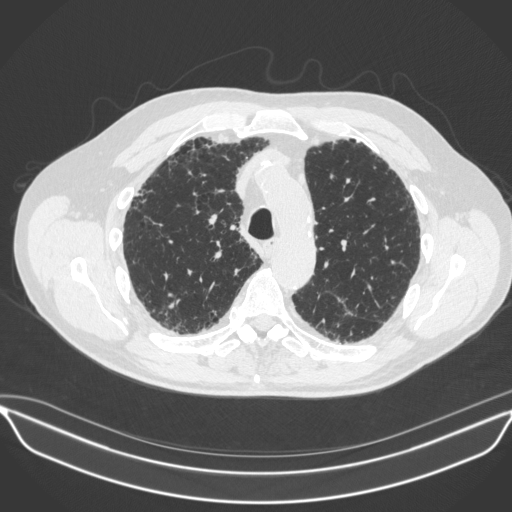
[im 440/550  lung]
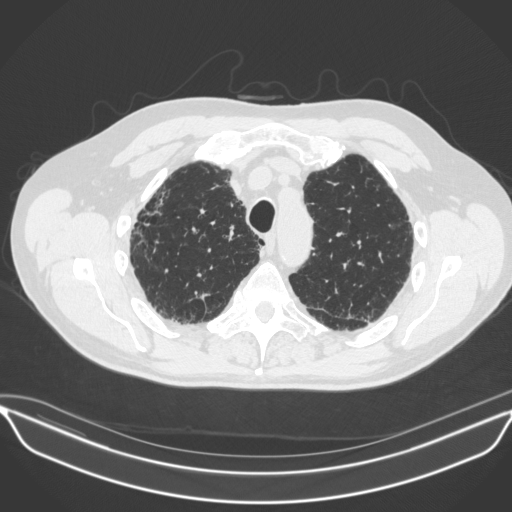
[im 467/550  mediastinal]
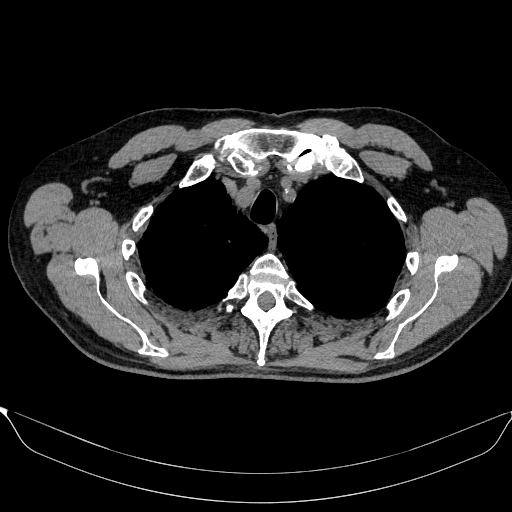
[im 467/550  lung]
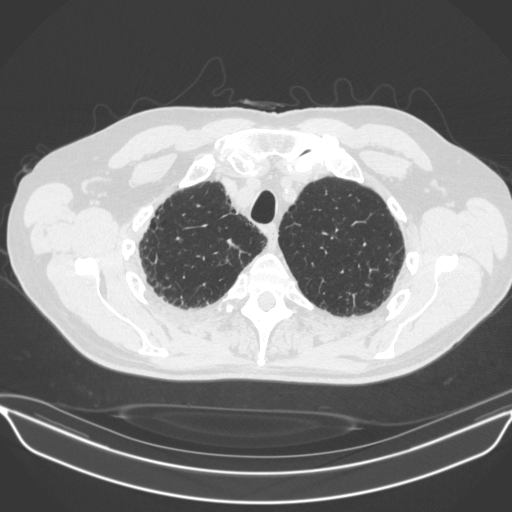
[im 522/550  lung]
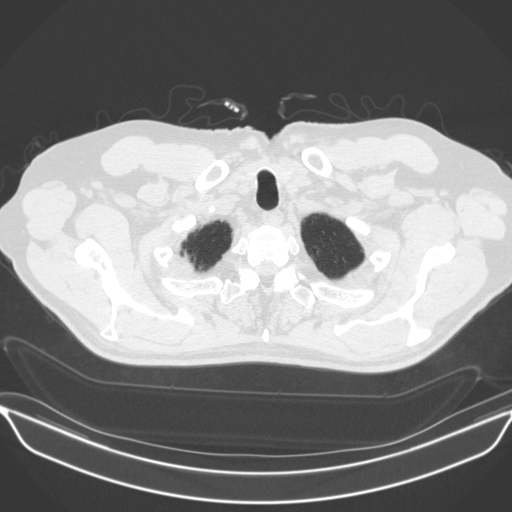

[14 of 32 positions shown; findings below may reference images not displayed]

FINDINGS: Cardiovascular: Normal heart size. No pericardial effusion. Severe
coronary artery calcifications of the LAD and circumflex.
Atherosclerotic disease of the thoracic aorta.

Mediastinum/Nodes: Esophagus and thyroid are unremarkable. No
pathologically enlarged lymph nodes seen in the chest.

Lungs/Pleura: Central airways are patent. Mild upper lobe paraseptal
emphysema. No significant air trapping. Subpleural predominant
reticular opacities with a mild mid and lower lung predominance.
Traction bronchiectasis with honeycomb change seen in the anterior
left upper lobe and right lower lobe. Stable small solid pulmonary
nodule of the right upper lobe measuring 4 mm on series 8 image
165.

Upper Abdomen: No acute abnormality.

Musculoskeletal: No chest wall mass or suspicious bone lesions
identified.
IMPRESSION: 1. Subpleural and lower lung predominant reticular opacities with
traction bronchiectasis and mild honeycomb change. No significant
air trapping is seen. Findings are consistent with UIP per consensus
guidelines: Diagnosis of Idiopathic Pulmonary Fibrosis: An Official
ATS/ERS/JRS/ALAT Clinical Practice Guideline. Am J Respir Crit Care
Med Vol 198, Jim 5, [id number][DATE].
2. Stable small solid pulmonary nodule of the right upper lobe.
Recommend attention on annual lung cancer screening CT.
3. Aortic Atherosclerosis (5FTO3-BEW.W) and Emphysema (5FTO3-TI0.0).

## 2023-05-09 NOTE — Progress Notes (Signed)
Subjective:  Patient ID: Adrian Allen, male    DOB: [birth year]  Age: 72 y.o. MRN: [id number]  Chief Complaint  Patient presents with   Medical Management of Chronic Issues    HPI Hyperlipidemia:  Crestor 40 mg daily for the last couple weeks.   GERD:  Hx of stomach ulcer. Found to have aortic atherosclerosis but pt saw Dr. Dulce Sellar. Per Dr. Jennye Boroughs due to stomach ulcer, pt was told not to take the aspirin.   Eats healthy and exercises.        05/10/2023    9:37 AM 02/07/2023    7:55 AM 08/12/2022    9:22 AM 09/09/2021    8:32 AM 07/02/2021    8:29 AM  Depression screen PHQ 2/9  Decreased Interest 0 0 0 0 0  Down, Depressed, Hopeless 0 0 0 0 0  PHQ - 2 Score 0 0 0 0 0        05/10/2023    9:37 AM  Fall Risk   Falls in the past year? 0  Number falls in past yr: 0  Injury with Fall? 0  Risk for fall due to : No Fall Risks  Follow up Falls evaluation completed    Patient Care Team: Blane Ohara, MD as PCP - General (Internal Medicine) Blane Ohara, MD as Referring Physician (Internal Medicine) Omar Person, MD as Consulting Physician (Pulmonary Disease)   Review of Systems  Constitutional:  Negative for chills, diaphoresis, fatigue and fever.  HENT:  Negative for congestion, ear pain and sore throat.   Respiratory:  Negative for cough and shortness of breath.   Cardiovascular:  Negative for chest pain and leg swelling.  Gastrointestinal:  Negative for abdominal pain, constipation, diarrhea, nausea and vomiting.  Genitourinary:  Negative for dysuria and urgency.  Musculoskeletal:  Negative for arthralgias and myalgias.  Neurological:  Negative for dizziness and headaches.  Psychiatric/Behavioral:  Negative for dysphoric mood.     Current Outpatient Medications on File Prior to Visit  Medication Sig Dispense Refill   nitroGLYCERIN (NITROSTAT) 0.4 MG SL tablet Place 1 tablet (0.4 mg total) under the tongue every 5 (five) minutes as needed. 25 tablet 6    rosuvastatin (CRESTOR) 40 MG tablet Take 1 tablet (40 mg total) by mouth daily. 90 tablet 1   No current facility-administered medications on file prior to visit.   Past Medical History:  Diagnosis Date   Acute gastric ulcer with hemorrhage 06/24/2020   Arthritis    right ankle   Closed fracture of upper end of left fibula 09/23/2020   Closed nondisplaced fracture of body of right scapula 09/23/2020   Closed nondisplaced fracture of third metatarsal bone of left foot 09/23/2020   Gastroesophageal reflux disease 06/24/2020   Mixed conductive and sensorineural hearing loss, bilateral 06/24/2020   Mixed hyperlipidemia 06/24/2020   Past Surgical History:  Procedure Laterality Date   FRACTURE SURGERY     right ankle surgery in 1974   KNEE ARTHROSCOPY  2023   meniscal tears.   SKIN CANCER EXCISION Left 05/25/2020    Family History  Problem Relation Age of Onset   Febrile seizures Mother    Stomach cancer Father    Heart Problems Brother    Heart Problems Maternal Uncle    Birth defects Maternal Uncle    Learning disabilities Maternal Grandmother    Social History   Socioeconomic History   Marital status: Married    Spouse name: Twilla   Number of children: Not  on file   Years of education: Not on file   Highest education level: Not on file  Occupational History   Occupation: Retired  Tobacco Use   Smoking status: Former    Packs/day: 1.50    Years: 48.00    Additional pack years: 0.00    Total pack years: 72.00    Types: Cigarettes    Quit date: 05/20/2020    Years since quitting: 2.9   Smokeless tobacco: Never  Vaping Use   Vaping Use: Never used  Substance and Sexual Activity   Alcohol use: Not Currently   Drug use: No   Sexual activity: Yes    Partners: Female  Other Topics Concern   Not on file  Social History Narrative   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   Social Determinants of Health   Financial Resource Strain: Low Risk   (08/12/2022)   Overall Financial Resource Strain (CARDIA)    Difficulty of Paying Living Expenses: Not hard at all  Food Insecurity: No Food Insecurity (08/12/2022)   Hunger Vital Sign    Worried About Running Out of Food in the Last Year: Never true    Ran Out of Food in the Last Year: Never true  Transportation Needs: No Transportation Needs (08/12/2022)   PRAPARE - Administrator, Civil Service (Medical): No    Lack of Transportation (Non-Medical): No  Physical Activity: Sufficiently Active (08/12/2022)   Exercise Vital Sign    Days of Exercise per Week: 4 days    Minutes of Exercise per Session: 60 min  Stress: No Stress Concern Present (08/12/2022)   [employer] of Occupational Health - Occupational Stress Questionnaire    Feeling of Stress : Not at all  Social Connections: Moderately Integrated (08/12/2022)   Social Connection and Isolation Panel [NHANES]    Frequency of Communication with Friends and Family: More than three times a week    Frequency of Social Gatherings with Friends and Family: More than three times a week    Attends Religious Services: More than 4 times per year    Active Member of [employer] or Organizations: No    Attends Engineer, structural: Not on file    Marital Status: Married    Objective:  BP 124/72   Pulse 73   Temp (!) 97.1 F (36.2 C)   Ht 6\' 1"  (1.854 m)   Wt 201 lb (91.2 kg)   SpO2 94%   BMI 26.52 kg/m      05/10/2023    9:35 AM 02/07/2023    7:54 AM 01/05/2023    1:44 PM  BP/Weight  Systolic BP 124 124 114  Diastolic BP 72 70 78  Wt. (Lbs) 201 203 204  BMI 26.52 kg/m2 26.78 kg/m2 26.91 kg/m2    Physical Exam Vitals reviewed.  Constitutional:      Appearance: Normal appearance. He is normal weight.  Cardiovascular:     Rate and Rhythm: Normal rate and regular rhythm.     Heart sounds: No murmur heard. Pulmonary:     Effort: Pulmonary effort is normal.     Breath sounds: Normal breath sounds.  Abdominal:      General: Abdomen is flat. Bowel sounds are normal.     Palpations: Abdomen is soft.     Tenderness: There is no abdominal tenderness.  Neurological:     Mental Status: He is alert and oriented to person, place, and time.  Psychiatric:  Mood and Affect: Mood normal.        Behavior: Behavior normal.     Diabetic Foot Exam - Simple   No data filed      Lab Results  Component Value Date   WBC 8.7 05/10/2023   HGB 14.7 05/10/2023   HCT 42.7 05/10/2023   PLT 148 (L) 05/10/2023   GLUCOSE 111 (H) 05/10/2023   CHOL 177 05/10/2023   TRIG 142 05/10/2023   HDL 47 05/10/2023   LDLCALC 105 (H) 05/10/2023   ALT 28 05/10/2023   AST 30 05/10/2023   NA 140 05/10/2023   K 5.4 (H) 05/10/2023   CL 103 05/10/2023   CREATININE 1.45 (H) 05/10/2023   BUN 12 05/10/2023   CO2 27 05/10/2023   TSH 1.970 02/07/2023   HGBA1C 5.5 01/07/2022      Assessment & Plan:    Atherosclerosis of aorta (HCC) Assessment & Plan: Well controlled.  No changes to medicines.  Crestor 40 mg daily Continue to work on eating a healthy diet and exercise.  Labs drawn today.     Gastroesophageal reflux disease without esophagitis Assessment & Plan: The current medical regimen is effective;  continue present plan and medications. Pantoprazole 40 mg daily.  Orders: -     CBC with Differential/Platelet  Mixed hyperlipidemia Assessment & Plan: Well controlled.  No changes to medicines. Rosuvastatin 40 mg daily Continue to work on eating a healthy diet and exercise.  Labs drawn today.    Orders: -     Comprehensive metabolic panel -     Lipid panel     No orders of the defined types were placed in this encounter.   Orders Placed This Encounter  Procedures   CBC with Differential/Platelet   Comprehensive metabolic panel   Lipid panel     Follow-up: Return in about 3 months (around 08/10/2023) for chronic, fasting.   I,Marla I Leal-Borjas,acting as a scribe for Blane Ohara, MD.,have  documented all relevant documentation on the behalf of Blane Ohara, MD,as directed by  Blane Ohara, MD while in the presence of Blane Ohara, MD.   An After Visit Summary was printed and given to the patient.  I attest that I have reviewed this visit and agree with the plan scribed by my staff.   Blane Ohara, MD [facility] [phone]

## 2023-05-10 ENCOUNTER — Ambulatory Visit (INDEPENDENT_AMBULATORY_CARE_PROVIDER_SITE_OTHER): Payer: Medicare Other | Admitting: Family Medicine

## 2023-05-10 ENCOUNTER — Encounter: Payer: Self-pay | Admitting: Family Medicine

## 2023-05-10 ENCOUNTER — Other Ambulatory Visit: Payer: Self-pay | Admitting: Family Medicine

## 2023-05-10 VITALS — BP 124/72 | HR 73 | Temp 97.1°F | Ht 73.0 in | Wt 201.0 lb

## 2023-05-10 DIAGNOSIS — K219 Gastro-esophageal reflux disease without esophagitis: Secondary | ICD-10-CM

## 2023-05-10 DIAGNOSIS — I700 Atherosclerosis of aorta: Secondary | ICD-10-CM

## 2023-05-10 DIAGNOSIS — E782 Mixed hyperlipidemia: Secondary | ICD-10-CM | POA: Diagnosis not present

## 2023-05-10 DIAGNOSIS — N401 Enlarged prostate with lower urinary tract symptoms: Secondary | ICD-10-CM

## 2023-05-11 LAB — CBC WITH DIFFERENTIAL/PLATELET
Basophils Absolute: 0.1 10*3/uL (ref 0.0–0.2)
Basos: 1 %
EOS (ABSOLUTE): 0.2 10*3/uL (ref 0.0–0.4)
Eos: 2 %
Hematocrit: 42.7 % (ref 37.5–51.0)
Hemoglobin: 14.7 g/dL (ref 13.0–17.7)
Immature Grans (Abs): 0.1 10*3/uL (ref 0.0–0.1)
Immature Granulocytes: 1 %
Lymphocytes Absolute: 2.1 10*3/uL (ref 0.7–3.1)
Lymphs: 25 %
MCH: 32.5 pg (ref 26.6–33.0)
MCHC: 34.4 g/dL (ref 31.5–35.7)
MCV: 94 fL (ref 79–97)
Monocytes Absolute: 0.8 10*3/uL (ref 0.1–0.9)
Monocytes: 9 %
Neutrophils Absolute: 5.4 10*3/uL (ref 1.4–7.0)
Neutrophils: 62 %
Platelets: 148 10*3/uL — ABNORMAL LOW (ref 150–450)
RBC: 4.53 x10E6/uL (ref 4.14–5.80)
RDW: 11.1 % — ABNORMAL LOW (ref 11.6–15.4)
WBC: 8.7 10*3/uL (ref 3.4–10.8)

## 2023-05-11 LAB — COMPREHENSIVE METABOLIC PANEL
ALT: 28 IU/L (ref 0–44)
AST: 30 IU/L (ref 0–40)
Albumin/Globulin Ratio: 1.6 (ref 1.2–2.2)
Albumin: 4.2 g/dL (ref 3.9–4.9)
Alkaline Phosphatase: 100 IU/L (ref 44–121)
BUN/Creatinine Ratio: 8 — ABNORMAL LOW (ref 10–24)
BUN: 12 mg/dL (ref 8–27)
Bilirubin Total: 0.9 mg/dL (ref 0.0–1.2)
CO2: 27 mmol/L (ref 20–29)
Calcium: 9.3 mg/dL (ref 8.6–10.2)
Chloride: 103 mmol/L (ref 96–106)
Creatinine, Ser: 1.45 mg/dL — ABNORMAL HIGH (ref 0.76–1.27)
Globulin, Total: 2.6 g/dL (ref 1.5–4.5)
Glucose: 111 mg/dL — ABNORMAL HIGH (ref 70–99)
Potassium: 5.4 mmol/L — ABNORMAL HIGH (ref 3.5–5.2)
Sodium: 140 mmol/L (ref 134–144)
Total Protein: 6.8 g/dL (ref 6.0–8.5)
eGFR: 52 mL/min/{1.73_m2} — ABNORMAL LOW (ref >59)

## 2023-05-11 LAB — LIPID PANEL
Chol/HDL Ratio: 3.8 ratio (ref 0.0–5.0)
Cholesterol, Total: 177 mg/dL (ref 100–199)
HDL: 47 mg/dL (ref >39)
LDL Chol Calc (NIH): 105 mg/dL — ABNORMAL HIGH (ref 0–99)
Triglycerides: 142 mg/dL (ref 0–149)
VLDL Cholesterol Cal: 25 mg/dL (ref 5–40)

## 2023-05-11 LAB — CARDIOVASCULAR RISK ASSESSMENT

## 2023-05-11 NOTE — Assessment & Plan Note (Addendum)
Well controlled.  No changes to medicines. Crestor 40 mg daily.  Continue to work on eating a healthy diet and exercise.  Labs drawn today.   

## 2023-05-11 NOTE — Assessment & Plan Note (Signed)
The current medical regimen is effective;  continue present plan and medications. Pantoprazole 40 mg daily. 

## 2023-05-11 NOTE — Assessment & Plan Note (Addendum)
Well controlled.  No changes to medicines. Rosuvastatin 40 mg daily. Continue to work on eating a healthy diet and exercise.  Labs drawn today.   

## 2023-05-17 LAB — HEMOGLOBIN A1C
Est. average glucose Bld gHb Est-mCnc: 120 mg/dL
Hgb A1c MFr Bld: 5.8 % — ABNORMAL HIGH (ref 4.8–5.6)

## 2023-05-17 LAB — SPECIMEN STATUS REPORT

## 2023-05-19 ENCOUNTER — Other Ambulatory Visit: Payer: Self-pay

## 2023-05-19 DIAGNOSIS — E875 Hyperkalemia: Secondary | ICD-10-CM

## 2023-05-24 ENCOUNTER — Other Ambulatory Visit: Payer: Medicare Other

## 2023-05-24 DIAGNOSIS — E875 Hyperkalemia: Secondary | ICD-10-CM

## 2023-05-25 ENCOUNTER — Ambulatory Visit
Admission: RE | Admit: 2023-05-25 | Discharge: 2023-05-25 | Disposition: A | Discharge status: Home / Self Care | Payer: Medicare Other | Source: Ambulatory Visit | Attending: Student | Admitting: Student

## 2023-05-25 DIAGNOSIS — F17200 Nicotine dependence, unspecified, uncomplicated: Secondary | ICD-10-CM

## 2023-05-25 LAB — LITHOLINK CKD PROGRAM

## 2023-05-25 LAB — COMPREHENSIVE METABOLIC PANEL
ALT: 27 IU/L (ref 0–44)
AST: 27 IU/L (ref 0–40)
Albumin/Globulin Ratio: 1.5 (ref 1.2–2.2)
Albumin: 4.1 g/dL (ref 3.9–4.9)
Alkaline Phosphatase: 89 IU/L (ref 44–121)
BUN/Creatinine Ratio: 8 — ABNORMAL LOW (ref 10–24)
BUN: 11 mg/dL (ref 8–27)
Bilirubin Total: 0.8 mg/dL (ref 0.0–1.2)
CO2: 25 mmol/L (ref 20–29)
Calcium: 9.5 mg/dL (ref 8.6–10.2)
Chloride: 103 mmol/L (ref 96–106)
Creatinine, Ser: 1.4 mg/dL — ABNORMAL HIGH (ref 0.76–1.27)
Globulin, Total: 2.7 g/dL (ref 1.5–4.5)
Glucose: 94 mg/dL (ref 70–99)
Potassium: 5.8 mmol/L — ABNORMAL HIGH (ref 3.5–5.2)
Sodium: 141 mmol/L (ref 134–144)
Total Protein: 6.8 g/dL (ref 6.0–8.5)
eGFR: 54 mL/min/{1.73_m2} — ABNORMAL LOW (ref >59)

## 2023-05-27 ENCOUNTER — Other Ambulatory Visit: Payer: Medicare Other

## 2023-05-27 ENCOUNTER — Other Ambulatory Visit: Payer: Self-pay

## 2023-05-27 DIAGNOSIS — E875 Hyperkalemia: Secondary | ICD-10-CM

## 2023-05-27 LAB — COMPREHENSIVE METABOLIC PANEL
ALT: 30 IU/L (ref 0–44)
AST: 26 IU/L (ref 0–40)
Albumin/Globulin Ratio: 1.5 (ref 1.2–2.2)
Albumin: 4.2 g/dL (ref 3.9–4.9)
Alkaline Phosphatase: 105 IU/L (ref 44–121)
BUN/Creatinine Ratio: 8 — ABNORMAL LOW (ref 10–24)
BUN: 11 mg/dL (ref 8–27)
Bilirubin Total: 0.9 mg/dL (ref 0.0–1.2)
CO2: 27 mmol/L (ref 20–29)
Calcium: 10 mg/dL (ref 8.6–10.2)
Chloride: 102 mmol/L (ref 96–106)
Creatinine, Ser: 1.35 mg/dL — ABNORMAL HIGH (ref 0.76–1.27)
Globulin, Total: 2.8 g/dL (ref 1.5–4.5)
Glucose: 203 mg/dL — ABNORMAL HIGH (ref 70–99)
Potassium: 5.1 mmol/L (ref 3.5–5.2)
Sodium: 139 mmol/L (ref 134–144)
Total Protein: 7 g/dL (ref 6.0–8.5)
eGFR: 56 mL/min/{1.73_m2} — ABNORMAL LOW (ref >59)

## 2023-05-27 LAB — LITHOLINK CKD PROGRAM

## 2023-06-29 ENCOUNTER — Telehealth: Payer: Self-pay | Admitting: Student

## 2023-06-29 NOTE — Telephone Encounter (Signed)
Can we make appointment with Dr. Isaiah Serge or Dr. Marchelle Gearing next available, needs PFT same day  Thanks!

## 2023-07-13 ENCOUNTER — Encounter: Payer: Self-pay | Admitting: Dermatology

## 2023-08-21 NOTE — Patient Instructions (Signed)
Health Maintenance, Male Adopting a healthy lifestyle and getting preventive care are important in promoting health and wellness. Ask your health care provider about: The right schedule for you to have regular tests and exams. Things you can do on your own to prevent diseases and keep yourself healthy. What should I know about diet, weight, and exercise? Eat a healthy diet  Eat a diet that includes plenty of vegetables, fruits, low-fat dairy products, and lean protein. Do not eat a lot of foods that are high in solid fats, added sugars, or sodium. Maintain a healthy weight Body mass index (BMI) is a measurement that can be used to identify possible weight problems. It estimates body fat based on height and weight. Your health care provider can help determine your BMI and help you achieve or maintain a healthy weight. Get regular exercise Get regular exercise. This is one of the most important things you can do for your health. Most adults should: Exercise for at least 150 minutes each week. The exercise should increase your heart rate and make you sweat (moderate-intensity exercise). Do strengthening exercises at least twice a week. This is in addition to the moderate-intensity exercise. Spend less time sitting. Even light physical activity can be beneficial. Watch cholesterol and blood lipids Have your blood tested for lipids and cholesterol at 70 years of age, then have this test every 5 years. You may need to have your cholesterol levels checked more often if: Your lipid or cholesterol levels are high. You are older than 70 years of age. You are at high risk for heart disease. What should I know about cancer screening? Many types of cancers can be detected early and may often be prevented. Depending on your health history and family history, you may need to have cancer screening at various ages. This may include screening for: Colorectal cancer. Prostate cancer. Skin cancer. Lung  cancer. What should I know about heart disease, diabetes, and high blood pressure? Blood pressure and heart disease High blood pressure causes heart disease and increases the risk of stroke. This is more likely to develop in people who have high blood pressure readings or are overweight. Talk with your health care provider about your target blood pressure readings. Have your blood pressure checked: Every 3-5 years if you are 18-39 years of age. Every year if you are 40 years old or older. If you are between the ages of 65 and 75 and are a current or former smoker, ask your health care provider if you should have a one-time screening for abdominal aortic aneurysm (AAA). Diabetes Have regular diabetes screenings. This checks your fasting blood sugar level. Have the screening done: Once every three years after age 45 if you are at a normal weight and have a low risk for diabetes. More often and at a younger age if you are overweight or have a high risk for diabetes. What should I know about preventing infection? Hepatitis B If you have a higher risk for hepatitis B, you should be screened for this virus. Talk with your health care provider to find out if you are at risk for hepatitis B infection. Hepatitis C Blood testing is recommended for: Everyone born from 1945 through 1965. Anyone with known risk factors for hepatitis C. Sexually transmitted infections (STIs) You should be screened each year for STIs, including gonorrhea and chlamydia, if: You are sexually active and are younger than 70 years of age. You are older than 70 years of age and your   health care provider tells you that you are at risk for this type of infection. Your sexual activity has changed since you were last screened, and you are at increased risk for chlamydia or gonorrhea. Ask your health care provider if you are at risk. Ask your health care provider about whether you are at high risk for HIV. Your health care provider  may recommend a prescription medicine to help prevent HIV infection. If you choose to take medicine to prevent HIV, you should first get tested for HIV. You should then be tested every 3 months for as long as you are taking the medicine. Follow these instructions at home: Alcohol use Do not drink alcohol if your health care provider tells you not to drink. If you drink alcohol: Limit how much you have to 0-2 drinks a day. Know how much alcohol is in your drink. In the [place] one drink equals one 12 oz bottle of beer (355 mL), one 5 oz glass of wine (148 mL), or one 1 oz glass of hard liquor (44 mL). Lifestyle Do not use any products that contain nicotine or tobacco. These products include cigarettes, chewing tobacco, and vaping devices, such as e-cigarettes. If you need help quitting, ask your health care provider. Do not use street drugs. Do not share needles. Ask your health care provider for help if you need support or information about quitting drugs. General instructions Schedule regular health, dental, and eye exams. Stay current with your vaccines. Tell your health care provider if: You often feel depressed. You have ever been abused or do not feel safe at home. Summary Adopting a healthy lifestyle and getting preventive care are important in promoting health and wellness. Follow your health care provider's instructions about healthy diet, exercising, and getting tested or screened for diseases. Follow your health care provider's instructions on monitoring your cholesterol and blood pressure. This information is not intended to replace advice given to you by your health care provider. Make sure you discuss any questions you have with your health care provider. Document Revised: 04/27/2021 Document Reviewed: 04/27/2021 Elsevier Patient Education  2024 [employer]  

## 2023-08-21 NOTE — Progress Notes (Signed)
Subjective:   Adrian Allen is a 72 y.o. male who presents for Medicare Annual/Subsequent preventive examination.  Visit Complete: In person  HPI Patient Medicare AWV questionnaire was completed by the patient on in person; I have confirmed that all information answered by patient is correct and no changes since this date.  Hyperlipidemia:  Patient states he ran out of his Crestor back in June and hasn't been taking it. Patient fasting today for labs.  Elevated Glucose - Reviewed labs with patient. Glucose was 203 at last visit. Patient walks 30 minutes twice a week and eats a healthy diet.   Arthrosclerosis of Aorta:  Patient currently not having reflux symptoms. Hx of stomach ulcer. Despite having aortic atherosclerosis, Dr. Jennye Boroughs recommended to stop taking aspirin.     Review of Systems    Review of Systems  Constitutional:  Negative for chills, fever and malaise/fatigue.  HENT:  Negative for ear pain, sinus pain and sore throat.   Respiratory:  Negative for cough and shortness of breath.   Cardiovascular:  Negative for chest pain.  Musculoskeletal:  Negative for myalgias.  Neurological:  Negative for headaches.   Physical Exam Constitutional:      Appearance: Normal appearance. He is obese.  Cardiovascular:     Rate and Rhythm: Normal rate and regular rhythm.     Heart sounds: Normal heart sounds. No murmur heard. Pulmonary:     Effort: Pulmonary effort is normal.     Breath sounds: Normal breath sounds.  Abdominal:     Palpations: Abdomen is soft.     Tenderness: There is no abdominal tenderness.  Musculoskeletal:     Cervical back: Normal range of motion and neck supple.  Skin:    General: Skin is warm.  Neurological:     Mental Status: He is alert.  Psychiatric:        Mood and Affect: Mood normal.        Behavior: Behavior normal.     Cardiac Risk Factors include: advanced age (>70men, >39 women)     Objective:    Today's Vitals    08/23/23 0802  BP: 128/80  Pulse: 62  Temp: (!) 96.4 F (35.8 C)  SpO2: 93%  Weight: 202 lb (91.6 kg)  Height: 6\' 1"  (1.854 m)  PainSc: 0-No pain   Body mass index is 26.65 kg/m.  The 10-year ASCVD risk score (Arnett DK, et al., 2019) is: 18.3%   Values used to calculate the score:     Age: 53 years     Sex: Male     Is Non-Hispanic African American: No     Diabetic: No     Tobacco smoker: No     Systolic Blood Pressure: 128 mmHg     Is BP treated: No     HDL Cholesterol: 44 mg/dL     Total Cholesterol: 184 mg/dL      0/08/8118    1:47 AM 08/12/2022    9:24 AM 03/17/2017    9:52 AM  Advanced Directives  Does Patient Have a Medical Advance Directive? No No No  Would patient like information on creating a medical advance directive? No - Patient declined      Current Medications (verified) Outpatient Encounter Medications as of 08/23/2023  Medication Sig   nitroGLYCERIN (NITROSTAT) 0.4 MG SL tablet Place 1 tablet (0.4 mg total) under the tongue every 5 (five) minutes as needed.   rosuvastatin (CRESTOR) 40 MG tablet Take 1 tablet (40 mg total)  by mouth daily.   No facility-administered encounter medications on file as of 08/23/2023.    Allergies (verified) Patient has no known allergies.   History: Past Medical History:  Diagnosis Date   Acute gastric ulcer with hemorrhage 06/24/2020   Arthritis    right ankle   Closed fracture of upper end of left fibula 09/23/2020   Closed nondisplaced fracture of body of right scapula 09/23/2020   Closed nondisplaced fracture of third metatarsal bone of left foot 09/23/2020   Gastroesophageal reflux disease 06/24/2020   Mixed conductive and sensorineural hearing loss, bilateral 06/24/2020   Mixed hyperlipidemia 06/24/2020   Past Surgical History:  Procedure Laterality Date   FRACTURE SURGERY     right ankle surgery in 1974   KNEE ARTHROSCOPY  2023   meniscal tears.   SKIN CANCER EXCISION Left 05/25/2020   Family History  Problem  Relation Age of Onset   Febrile seizures Mother    Stomach cancer Father    Heart Problems Brother    Heart Problems Maternal Uncle    Birth defects Maternal Uncle    Learning disabilities Maternal Grandmother    Social History   Socioeconomic History   Marital status: Married    Spouse name: Twilla   Number of children: Not on file   Years of education: Not on file   Highest education level: Not on file  Occupational History   Occupation: Retired  Tobacco Use   Smoking status: Former    Current packs/day: 0.00    Average packs/day: 1.5 packs/day for 48.0 years (72.0 ttl pk-yrs)    Types: Cigarettes    Start date: 05/20/1972    Quit date: 05/20/2020    Years since quitting: 3.2   Smokeless tobacco: Never  Vaping Use   Vaping status: Never Used  Substance and Sexual Activity   Alcohol use: Not Currently   Drug use: No   Sexual activity: Yes    Partners: Female  Other Topics Concern   Not on file  Social History Narrative   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   Social Determinants of Health   Financial Resource Strain: Low Risk  (08/23/2023)   Overall Financial Resource Strain (CARDIA)    Difficulty of Paying Living Expenses: Not hard at all  Food Insecurity: No Food Insecurity (08/23/2023)   Hunger Vital Sign    Worried About Running Out of Food in the Last Year: Never true    Ran Out of Food in the Last Year: Never true  Transportation Needs: No Transportation Needs (08/23/2023)   PRAPARE - Administrator, Civil Service (Medical): No    Lack of Transportation (Non-Medical): No  Physical Activity: Sufficiently Active (08/23/2023)   Exercise Vital Sign    Days of Exercise per Week: 4 days    Minutes of Exercise per Session: 60 min  Stress: No Stress Concern Present (08/23/2023)   [employer] of Occupational Health - Occupational Stress Questionnaire    Feeling of Stress : Not at all  Social Connections: Moderately Integrated  (08/23/2023)   Social Connection and Isolation Panel [NHANES]    Frequency of Communication with Friends and Family: More than three times a week    Frequency of Social Gatherings with Friends and Family: More than three times a week    Attends Religious Services: More than 4 times per year    Active Member of [employer] or Organizations: No    Attends Banker Meetings: Never  Marital Status: Married    Tobacco Counseling Counseling given: Not Answered   Clinical Intake:  Pre-visit preparation completed: Yes  Pain : No/denies pain Pain Score: 0-No pain     Nutritional Status: BMI 25 -29 Overweight Diabetes: No  How often do you need to have someone help you when you read instructions, pamphlets, or other written materials from your doctor or pharmacy?: 1 - Never  Interpreter Needed?: No      Activities of Daily Living    08/23/2023    8:06 AM 02/07/2023    7:55 AM  In your present state of health, do you have any difficulty performing the following activities:  Hearing? 0 0  Vision? 0 0  Difficulty concentrating or making decisions? 0 0  Walking or climbing stairs? 0 0  Dressing or bathing? 0 0  Doing errands, shopping? 0 0  Preparing Food and eating ? N   Using the Toilet? N   In the past six months, have you accidently leaked urine? N   Do you have problems with loss of bowel control? N   Managing your Medications? N   Managing your Finances? N   Housekeeping or managing your Housekeeping? N     Patient Care Team: Blane Ohara, MD as PCP - General (Internal Medicine) Blane Ohara, MD as Referring Physician (Internal Medicine) Omar Person, MD (Inactive) as Consulting Physician (Pulmonary Disease)  Indicate any recent Medical Services you may have received from other than [facility] providers in the past year (date may be approximate).     Assessment:   This is a routine wellness examination for Darell. Encounter for Medicare annual wellness  exam Assessment & Plan: Recommend continue working on diet and exercise.    Immunization due Assessment & Plan: Flu vaccine administered today.   Mixed hyperlipidemia Assessment & Plan: Patient not currently taking Crestor. Labs drawn today Await labs/testing for assessment and recommendations.   Orders: -     Lipid panel -     Comprehensive metabolic panel -     CBC  Atherosclerosis of aorta [facility]) Assessment & Plan: Stable Labs drawn today Await labs/testing for assessment and recommendations.    Elevated glucose level Assessment & Plan: Glucose was 203 at last visit.  A1C drawn today Await labs/testing for assessment and recommendations.   Orders: -     Hemoglobin A1c  Encounter for immunization -     Flu Vaccine Trivalent High Dose (Fluad)    Hearing/Vision screen No results found.  Dietary issues and exercise activities discussed: yes     Goals Addressed   None   Depression Screen    08/23/2023    8:06 AM 05/10/2023    9:37 AM 02/07/2023    7:55 AM 08/12/2022    9:22 AM 09/09/2021    8:32 AM 07/02/2021    8:29 AM 07/02/2021    8:07 AM  PHQ 2/9 Scores  PHQ - 2 Score 0 0 0 0 0 0 0    Fall Risk    08/23/2023    8:06 AM 05/10/2023    9:37 AM 02/07/2023    7:55 AM 08/12/2022    9:24 AM 01/07/2022    8:04 AM  Fall Risk   Falls in the past year? 0 0 0 0 0  Number falls in past yr: 0 0 0 0 0  Injury with Fall? 0 0 0 0 0  Risk for fall due to : No Fall Risks No Fall Risks No Fall Risks  No Fall Risks No Fall Risks  Follow up Falls evaluation completed Falls evaluation completed Falls evaluation completed Falls evaluation completed;Education provided Falls evaluation completed    MEDICARE RISK AT HOME:    TIMED UP AND GO:  Was the test performed?  No    Cognitive Function:        08/12/2022    9:25 AM  6CIT Screen  What Year? 0 points  What month? 0 points  What time? 0 points  Count back from 20 0 points  Months in reverse 0 points  Repeat  phrase 0 points  Total Score 0 points    Immunizations Immunization History  Administered Date(s) Administered   Fluad Quad(high Dose 65+) 09/12/2020, 09/07/2021, 09/17/2022   Fluad Trivalent(High Dose 65+) 08/23/2023   Moderna SARS-COV2 Booster Vaccination 11/04/2020   Moderna Sars-Covid-2 Vaccination 02/01/2020, 02/27/2020   PNEUMOCOCCAL CONJUGATE-20 01/07/2022   Pneumococcal Polysaccharide-23 05/06/2016, 06/20/2019    TDAP status: Due, Education has been provided regarding the importance of this vaccine. Advised may receive this vaccine at local pharmacy or Health Dept. Aware to provide a copy of the vaccination record if obtained from local pharmacy or Health Dept. Verbalized acceptance and understanding.  Flu Vaccine status: Completed at today's visit  Pneumococcal vaccine status: Up to date  Covid-19 vaccine status: Declined, Education has been provided regarding the importance of this vaccine but patient still declined. Advised may receive this vaccine at local pharmacy or Health Dept.or vaccine clinic. Aware to provide a copy of the vaccination record if obtained from local pharmacy or Health Dept. Verbalized acceptance and understanding.  Qualifies for Shingles Vaccine? Yes   Zostavax completed No   Shingrix Completed?: No.    Education has been provided regarding the importance of this vaccine. Patient has been advised to call insurance company to determine out of pocket expense if they have not yet received this vaccine. Advised may also receive vaccine at local pharmacy or Health Dept. Verbalized acceptance and understanding.  Screening Tests Health Maintenance  Topic Date Due   DTaP/Tdap/Td (1 - Tdap) Never done   Zoster Vaccines- Shingrix (1 of 2) Never done   COVID-19 Vaccine (4 - 2023-24 season) 08/21/2023   Lung Cancer Screening  05/24/2024   Medicare Annual Wellness (AWV)  08/22/2024   Colonoscopy  12/28/2026   Pneumonia Vaccine 37+ Years old  Completed    INFLUENZA VACCINE  Completed   Hepatitis C Screening  Completed   HPV VACCINES  Aged Out    Health Maintenance  Health Maintenance Due  Topic Date Due   DTaP/Tdap/Td (1 - Tdap) Never done   Zoster Vaccines- Shingrix (1 of 2) Never done   COVID-19 Vaccine (4 - 2023-24 season) 08/21/2023    Colorectal cancer screening: Type of screening: Colonoscopy. Completed 12/25/2016. Repeat every 5 years. Declined for [place] to schedule appt.  Lung Cancer Screening: (Low Dose CT Chest recommended if Age 79-80 years, 20 pack-year currently smoking OR have quit w/in 15years.) does qualify.   Lung Cancer Screening Referral: Completed 05/25/23  Additional Screening:  Hepatitis C Screening: does not qualify;   Vision Screening: Recommended annual ophthalmology exams for early detection of glaucoma and other disorders of the eye. Is the patient up to date with their annual eye exam?  Yes  Who is the provider or what is the name of the office in which the patient attends annual eye exams? [facility] If pt is not established with a provider, would they like to be referred to  a provider to establish care? No .   Dental Screening: Recommended annual dental exams for proper oral hygiene  Diabetic Foot Exam: NA  Community Resource Referral / Chronic Care Management: CRR required this visit?  No   CCM required this visit?  No     Plan:   Encounter for Medicare annual wellness exam Assessment & Plan: Recommend continue working on diet and exercise.    Immunization due Assessment & Plan: Flu vaccine administered today.   Mixed hyperlipidemia Assessment & Plan: Patient not currently taking Crestor. Labs drawn today Await labs/testing for assessment and recommendations.   Orders: -     Lipid panel -     Comprehensive metabolic panel -     CBC  Atherosclerosis of aorta [facility]) Assessment & Plan: Stable Labs drawn today Await labs/testing for assessment and recommendations.     Elevated glucose level Assessment & Plan: Glucose was 203 at last visit.  A1C drawn today Await labs/testing for assessment and recommendations.   Orders: -     Hemoglobin A1c  Encounter for immunization -     Flu Vaccine Trivalent High Dose (Fluad)   I have personally reviewed and noted the following in the patient's chart:   Medical and social history Use of alcohol, tobacco or illicit drugs  Current medications and supplements including opioid prescriptions. Patient is not currently taking opioid prescriptions. Functional ability and status Nutritional status Physical activity Advanced directives List of other physicians Hospitalizations, surgeries, and ER visits in previous 12 months Vitals Screenings to include cognitive, depression, and falls Referrals and appointments  In addition, I have reviewed and discussed with patient certain preventive protocols, quality metrics, and best practice recommendations. A written personalized care plan for preventive services as well as general preventive health recommendations were provided to patient.

## 2023-08-23 ENCOUNTER — Encounter: Payer: Self-pay | Admitting: Family Medicine

## 2023-08-23 ENCOUNTER — Ambulatory Visit (INDEPENDENT_AMBULATORY_CARE_PROVIDER_SITE_OTHER): Payer: Medicare Other | Admitting: Family Medicine

## 2023-08-23 VITALS — BP 128/80 | HR 62 | Temp 96.4°F | Ht 73.0 in | Wt 202.0 lb

## 2023-08-23 DIAGNOSIS — Z23 Encounter for immunization: Secondary | ICD-10-CM

## 2023-08-23 DIAGNOSIS — R7309 Other abnormal glucose: Secondary | ICD-10-CM

## 2023-08-23 DIAGNOSIS — Z0000 Encounter for general adult medical examination without abnormal findings: Secondary | ICD-10-CM | POA: Diagnosis not present

## 2023-08-23 DIAGNOSIS — E782 Mixed hyperlipidemia: Secondary | ICD-10-CM | POA: Diagnosis not present

## 2023-08-23 DIAGNOSIS — I700 Atherosclerosis of aorta: Secondary | ICD-10-CM | POA: Diagnosis not present

## 2023-08-23 NOTE — Assessment & Plan Note (Signed)
 Recommend continue working on diet and exercise

## 2023-08-23 NOTE — Assessment & Plan Note (Signed)
Flu vaccine administered today.

## 2023-08-23 NOTE — Assessment & Plan Note (Signed)
Patient not currently taking Crestor. Labs drawn today Await labs/testing for assessment and recommendations.

## 2023-08-23 NOTE — Assessment & Plan Note (Signed)
Glucose was 203 at last visit.  A1C drawn today Await labs/testing for assessment and recommendations.

## 2023-08-23 NOTE — Assessment & Plan Note (Signed)
Stable Labs drawn today Await labs/testing for assessment and recommendations.

## 2023-08-24 LAB — CBC
Hematocrit: 44.4 % (ref 37.5–51.0)
Hemoglobin: 14.9 g/dL (ref 13.0–17.7)
MCH: 32.6 pg (ref 26.6–33.0)
MCHC: 33.6 g/dL (ref 31.5–35.7)
MCV: 97 fL (ref 79–97)
Platelets: 154 10*3/uL (ref 150–450)
RBC: 4.57 x10E6/uL (ref 4.14–5.80)
RDW: 11.9 % (ref 11.6–15.4)
WBC: 7.6 10*3/uL (ref 3.4–10.8)

## 2023-08-24 LAB — COMPREHENSIVE METABOLIC PANEL
ALT: 28 IU/L (ref 0–44)
AST: 26 IU/L (ref 0–40)
Albumin: 4.3 g/dL (ref 3.9–4.9)
Alkaline Phosphatase: 110 IU/L (ref 44–121)
BUN/Creatinine Ratio: 9 — ABNORMAL LOW (ref 10–24)
BUN: 13 mg/dL (ref 8–27)
Bilirubin Total: 0.7 mg/dL (ref 0.0–1.2)
CO2: 26 mmol/L (ref 20–29)
Calcium: 10.3 mg/dL — ABNORMAL HIGH (ref 8.6–10.2)
Chloride: 102 mmol/L (ref 96–106)
Creatinine, Ser: 1.39 mg/dL — ABNORMAL HIGH (ref 0.76–1.27)
Globulin, Total: 2.4 g/dL (ref 1.5–4.5)
Glucose: 96 mg/dL (ref 70–99)
Potassium: 5.7 mmol/L — ABNORMAL HIGH (ref 3.5–5.2)
Sodium: 142 mmol/L (ref 134–144)
Total Protein: 6.7 g/dL (ref 6.0–8.5)
eGFR: 55 mL/min/{1.73_m2} — ABNORMAL LOW (ref >59)

## 2023-08-24 LAB — HEMOGLOBIN A1C
Est. average glucose Bld gHb Est-mCnc: 120 mg/dL
Hgb A1c MFr Bld: 5.8 % — ABNORMAL HIGH (ref 4.8–5.6)

## 2023-08-24 LAB — LIPID PANEL
Chol/HDL Ratio: 4.2 ratio (ref 0.0–5.0)
Cholesterol, Total: 184 mg/dL (ref 100–199)
HDL: 44 mg/dL (ref >39)
LDL Chol Calc (NIH): 120 mg/dL — ABNORMAL HIGH (ref 0–99)
Triglycerides: 112 mg/dL (ref 0–149)
VLDL Cholesterol Cal: 20 mg/dL (ref 5–40)

## 2024-01-16 DIAGNOSIS — L821 Other seborrheic keratosis: Secondary | ICD-10-CM | POA: Diagnosis not present

## 2024-01-16 DIAGNOSIS — L570 Actinic keratosis: Secondary | ICD-10-CM | POA: Diagnosis not present

## 2024-01-16 DIAGNOSIS — C44321 Squamous cell carcinoma of skin of nose: Secondary | ICD-10-CM | POA: Diagnosis not present

## 2024-01-16 DIAGNOSIS — Z85828 Personal history of other malignant neoplasm of skin: Secondary | ICD-10-CM | POA: Diagnosis not present

## 2024-01-16 DIAGNOSIS — D692 Other nonthrombocytopenic purpura: Secondary | ICD-10-CM | POA: Diagnosis not present

## 2024-01-19 ENCOUNTER — Encounter: Payer: Self-pay | Admitting: Dermatology

## 2024-02-20 DIAGNOSIS — C44321 Squamous cell carcinoma of skin of nose: Secondary | ICD-10-CM | POA: Diagnosis not present

## 2024-07-16 DIAGNOSIS — C44229 Squamous cell carcinoma of skin of left ear and external auricular canal: Secondary | ICD-10-CM | POA: Diagnosis not present

## 2024-07-16 DIAGNOSIS — L820 Inflamed seborrheic keratosis: Secondary | ICD-10-CM | POA: Diagnosis not present

## 2024-07-16 DIAGNOSIS — C44329 Squamous cell carcinoma of skin of other parts of face: Secondary | ICD-10-CM | POA: Diagnosis not present

## 2024-07-16 DIAGNOSIS — L570 Actinic keratosis: Secondary | ICD-10-CM | POA: Diagnosis not present

## 2024-08-21 ENCOUNTER — Ambulatory Visit (INDEPENDENT_AMBULATORY_CARE_PROVIDER_SITE_OTHER)

## 2024-08-21 DIAGNOSIS — Z23 Encounter for immunization: Secondary | ICD-10-CM | POA: Diagnosis not present

## 2024-08-30 DIAGNOSIS — C44329 Squamous cell carcinoma of skin of other parts of face: Secondary | ICD-10-CM | POA: Diagnosis not present

## 2024-08-30 DIAGNOSIS — C44229 Squamous cell carcinoma of skin of left ear and external auricular canal: Secondary | ICD-10-CM | POA: Diagnosis not present

## 2024-09-25 ENCOUNTER — Ambulatory Visit: Admitting: Podiatry

## 2024-09-25 ENCOUNTER — Ambulatory Visit

## 2024-09-25 ENCOUNTER — Encounter: Payer: Self-pay | Admitting: Podiatry

## 2024-09-25 DIAGNOSIS — M7751 Other enthesopathy of right foot: Secondary | ICD-10-CM

## 2024-09-25 DIAGNOSIS — M778 Other enthesopathies, not elsewhere classified: Secondary | ICD-10-CM

## 2024-09-25 DIAGNOSIS — M21621 Bunionette of right foot: Secondary | ICD-10-CM
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
## 2024-09-25 NOTE — Patient Instructions (Signed)
 Look for urea 40% cream or ointment and apply to the thickened dry skin / calluses. This can be bought over the counter, at a pharmacy or online such as [employer].  More silicone pads can be purchased from:  [email or web address].[email or web address]  You will also find felt padding on [employer].  Look for dancers pads to pad the callus underneath your right fifth metatarsal head.  Try using Tuli's heel cups which you can find on [employer] for your heel pain.  I also recommend avoiding going barefoot in the house on hardwood floors and trying to use at least a house shoe with good heel cushion.

## 2024-10-05 ENCOUNTER — Ambulatory Visit: Payer: Self-pay

## 2024-10-05 NOTE — Telephone Encounter (Signed)
 FYI Only or Action Required?: FYI only for provider.  Patient was last seen in primary care on 08/23/2023 by Sherre Clapper, MD.  Called Nurse Triage reporting Foot Pain.  Symptoms began several weeks ago.  Interventions attempted: Rest, hydration, or home remedies.  Symptoms are: unchanged.  Triage Disposition: See PCP When Office is Open (Within 3 Days)  Patient/caregiver understands and will follow disposition?: Yes  Copied from CRM #[id number]. Topic: Clinical - Red Word Triage >> Oct 05, 2024 10:49 AM Willma SAUNDERS wrote: Red Word that prompted transfer to Nurse Triage: Patient is having pain in his right foot for the last three weeks, that is causing difficulty walking. Reason for Disposition  [1] MODERATE pain (e.g., interferes with normal activities, limping) AND [2] present > 3 days  Answer Assessment - Initial Assessment Questions States for the last three weeks he's been having a sudden onset of pain to his R foot every time he puts pressure on. Denies injury or trauma/denies numbness tingling.   1. ONSET: When did the pain start?      3 weeks 2. LOCATION: Where is the pain located?      Right foot  3. PAIN: How bad is the pain?    (Scale 1-10; or mild, moderate, severe)     7-8/10 4. WORK OR EXERCISE: Has there been any recent work or exercise that involved this part of the body?      Denies; states it just suddenly began hurting 5. CAUSE: What do you think is causing the foot pain?     unsure 6. OTHER SYMPTOMS: Do you have any other symptoms? (e.g., leg pain, rash, fever, numbness)     denies  Protocols used: Foot Pain-A-AH

## 2024-10-08 ENCOUNTER — Ambulatory Visit (INDEPENDENT_AMBULATORY_CARE_PROVIDER_SITE_OTHER): Admitting: Physician Assistant

## 2024-10-08 ENCOUNTER — Encounter: Payer: Self-pay | Admitting: Physician Assistant

## 2024-10-08 VITALS — BP 122/78 | HR 74 | Temp 97.7°F | Ht 73.0 in | Wt 208.0 lb

## 2024-10-08 DIAGNOSIS — M722 Plantar fascial fibromatosis: Secondary | ICD-10-CM | POA: Diagnosis not present

## 2024-10-08 DIAGNOSIS — K219 Gastro-esophageal reflux disease without esophagitis: Secondary | ICD-10-CM | POA: Diagnosis not present

## 2024-10-08 MED ORDER — OMEPRAZOLE 40 MG PO CPDR
40.0000 mg | DELAYED_RELEASE_CAPSULE | Freq: Every day | ORAL | 3 refills | Status: AC
Start: 2024-10-08 — End: ?

## 2024-10-08 MED ORDER — NAPROXEN 500 MG PO TABS
500.0000 mg | ORAL_TABLET | Freq: Two times a day (BID) | ORAL | 0 refills | Status: AC
Start: 2024-10-08 — End: ?

## 2024-10-08 NOTE — Progress Notes (Signed)
 Acute Office Visit  Subjective:    Patient ID: Adrian Allen, male    DOB: [birth year], 73 y.o.   MRN: [id number]  Chief Complaint  Patient presents with   Right foot pain    HPI: Patient is in today for worsening right foot pain  Discussed the use of AI scribe software for clinical note transcription with the patient, who gave verbal consent to proceed.  History of Present Illness Adrian Allen is a 73 year old who presents with worsening Achilles pain.  He has experienced a significant increase in pain in the Achilles area, which has worsened over time. Initially mild, the pain was evaluated by a podiatrist but has since intensified, causing him to adjust his walking pattern due to its severity.  The pain is described as feeling like 'standing on a nail', particularly severe in the morning or after sitting for a while. It is located in the heel and radiates down the foot, causing a deep aching sensation. It improves slightly with walking but remains present.  He has tried Tylenol  and Voltaren gel without relief. He has not used ice or taken any antibiotics recently. He denies any recent changes in footwear or increased physical activity that could have contributed to the onset of symptoms.  He has a history of stomach ulcers, with a significant bleed several years ago that resulted in a hemoglobin level drop to seven. He is not currently taking any acid reducers like omeprazole or pantoprazole , but he does use famotidine.  No similar symptoms in the past.     Past Medical History:  Diagnosis Date   Acute gastric ulcer with hemorrhage 06/24/2020   Arthritis    right ankle   Closed fracture of upper end of left fibula 09/23/2020   Closed nondisplaced fracture of body of right scapula 09/23/2020   Closed nondisplaced fracture of third metatarsal bone of left foot 09/23/2020   Gastroesophageal reflux disease 06/24/2020   Mixed conductive and sensorineural hearing  loss, bilateral 06/24/2020   Mixed hyperlipidemia 06/24/2020    Past Surgical History:  Procedure Laterality Date   FRACTURE SURGERY     right ankle surgery in 1974   KNEE ARTHROSCOPY  2023   meniscal tears.   SKIN CANCER EXCISION Left 05/25/2020    Family History  Problem Relation Age of Onset   Febrile seizures Mother    Stomach cancer Father    Heart Problems Brother    Heart Problems Maternal Uncle    Birth defects Maternal Uncle    Learning disabilities Maternal Grandmother     Social History   Socioeconomic History   Marital status: Married    Spouse name: Twilla   Number of children: Not on file   Years of education: Not on file   Highest education level: Not on file  Occupational History   Occupation: Retired  Tobacco Use   Smoking status: Former    Current packs/day: 0.00    Average packs/day: 1.5 packs/day for 48.0 years (72.0 ttl pk-yrs)    Types: Cigarettes    Start date: 05/20/1972    Quit date: 05/20/2020    Years since quitting: 4.3   Smokeless tobacco: Never  Vaping Use   Vaping status: Never Used  Substance and Sexual Activity   Alcohol use: Not Currently   Drug use: No   Sexual activity: Yes    Partners: Female  Other Topics Concern   Not on file  Social History Narrative   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   Social Drivers of Health   Financial Resource Strain: Low Risk  (08/23/2023)   Overall Financial Resource Strain (CARDIA)    Difficulty of Paying Living Expenses: Not hard at all  Food Insecurity: No Food Insecurity (08/23/2023)   Hunger Vital Sign    Worried About Running Out of Food in the Last Year: Never true    Ran Out of Food in the Last Year: Never true  Transportation Needs: No Transportation Needs (08/23/2023)   PRAPARE - Administrator, Civil Service (Medical): No    Lack of Transportation (Non-Medical): No  Physical Activity: Sufficiently Active (08/23/2023)   Exercise Vital Sign    Days of  Exercise per Week: 4 days    Minutes of Exercise per Session: 60 min  Stress: No Stress Concern Present (08/23/2023)   [employer] of Occupational Health - Occupational Stress Questionnaire    Feeling of Stress : Not at all  Social Connections: Moderately Integrated (08/23/2023)   Social Connection and Isolation Panel    Frequency of Communication with Friends and Family: More than three times a week    Frequency of Social Gatherings with Friends and Family: More than three times a week    Attends Religious Services: More than 4 times per year    Active Member of [employer] or Organizations: No    Attends Banker Meetings: Never    Marital Status: Married  Catering manager Violence: Not At Risk (08/23/2023)   Humiliation, Afraid, Rape, and Kick questionnaire    Fear of Current or Ex-Partner: No    Emotionally Abused: No    Physically Abused: No    Sexually Abused: No    Outpatient Medications Prior to Visit  Medication Sig Dispense Refill   nitroGLYCERIN  (NITROSTAT ) 0.4 MG SL tablet Place 1 tablet (0.4 mg total) under the tongue every 5 (five) minutes as needed. 25 tablet 6   rosuvastatin  (CRESTOR ) 40 MG tablet Take 1 tablet (40 mg total) by mouth daily. 90 tablet 1   No facility-administered medications prior to visit.    No Known Allergies  Review of Systems  Constitutional:  Negative for appetite change, fatigue and fever.  HENT:  Negative for congestion, ear pain, sinus pressure and sore throat.   Respiratory:  Negative for cough, chest tightness, shortness of breath and wheezing.   Cardiovascular:  Negative for chest pain and palpitations.  Gastrointestinal:  Negative for abdominal pain, constipation, diarrhea, nausea and vomiting.  Genitourinary:  Negative for dysuria and hematuria.  Musculoskeletal:  Positive for myalgias (Right foot pain). Negative for arthralgias, back pain and joint swelling.  Skin:  Negative for rash.  Neurological:  Negative for dizziness,  weakness and headaches.  Psychiatric/Behavioral:  Negative for dysphoric mood. The patient is not nervous/anxious.        Objective:        10/08/2024    9:10 AM 08/23/2023    8:02 AM 05/10/2023    9:35 AM  Vitals with BMI  Height 6' 1 6' 1 6' 1  Weight 208 lbs 202 lbs 201 lbs  BMI 27.45 26.66 26.52  Systolic 122 128 875  Diastolic 78 80 72  Pulse 74 62 73    Orthostatic VS for the past 72 hrs (Last 3 readings):  Patient Position BP Location  10/08/24 0910 Sitting Left Arm     Physical Exam Musculoskeletal:     Right foot: Bunion, tenderness and crepitus present. No  swelling or deformity.     Left foot: No swelling or deformity.     Comments: Painful to palpation at plantar fascia insertion at the calcaneus.      Health Maintenance Due  Topic Date Due   DTaP/Tdap/Td (1 - Tdap) Never done   Zoster Vaccines- Shingrix (1 of 2) Never done   Lung Cancer Screening  05/24/2024   COVID-19 Vaccine (4 - 2025-26 season) 08/20/2024   Medicare Annual Wellness (AWV)  08/22/2024    There are no preventive care reminders to display for this patient.   Lab Results  Component Value Date   TSH 1.970 02/07/2023   Lab Results  Component Value Date   WBC 7.6 08/23/2023   HGB 14.9 08/23/2023   HCT 44.4 08/23/2023   MCV 97 08/23/2023   PLT 154 08/23/2023   Lab Results  Component Value Date   NA 142 08/23/2023   K 5.7 (H) 08/23/2023   CO2 26 08/23/2023   GLUCOSE 96 08/23/2023   BUN 13 08/23/2023   CREATININE 1.39 (H) 08/23/2023   BILITOT 0.7 08/23/2023   ALKPHOS 110 08/23/2023   AST 26 08/23/2023   ALT 28 08/23/2023   PROT 6.7 08/23/2023   ALBUMIN 4.3 08/23/2023   CALCIUM  10.3 (H) 08/23/2023   EGFR 55 (L) 08/23/2023   Lab Results  Component Value Date   CHOL 184 08/23/2023   Lab Results  Component Value Date   HDL 44 08/23/2023   Lab Results  Component Value Date   LDLCALC 120 (H) 08/23/2023   Lab Results  Component Value Date   TRIG 112 08/23/2023    Lab Results  Component Value Date   CHOLHDL 4.2 08/23/2023   Lab Results  Component Value Date   HGBA1C 5.8 (H) 08/23/2023        Results for orders placed or performed in visit on 08/23/23  Lipid Panel   Collection Time: 08/23/23  8:44 AM  Result Value Ref Range   Cholesterol, Total 184 100 - 199 mg/dL   Triglycerides 887 0 - 149 mg/dL   HDL 44 >60 mg/dL   VLDL Cholesterol Cal 20 5 - 40 mg/dL   LDL Chol Calc (NIH) 879 (H) 0 - 99 mg/dL   Chol/HDL Ratio 4.2 0.0 - 5.0 ratio  Comprehensive metabolic panel   Collection Time: 08/23/23  8:44 AM  Result Value Ref Range   Glucose 96 70 - 99 mg/dL   BUN 13 8 - 27 mg/dL   Creatinine, Ser 8.60 (H) 0.76 - 1.27 mg/dL   eGFR 55 (L) >40 fO/fpw/8.26   BUN/Creatinine Ratio 9 (L) 10 - 24   Sodium 142 134 - 144 mmol/L   Potassium 5.7 (H) 3.5 - 5.2 mmol/L   Chloride 102 96 - 106 mmol/L   CO2 26 20 - 29 mmol/L   Calcium  10.3 (H) 8.6 - 10.2 mg/dL   Total Protein 6.7 6.0 - 8.5 g/dL   Albumin 4.3 3.9 - 4.9 g/dL   Globulin, Total 2.4 1.5 - 4.5 g/dL   Bilirubin Total 0.7 0.0 - 1.2 mg/dL   Alkaline Phosphatase 110 44 - 121 IU/L   AST 26 0 - 40 IU/L   ALT 28 0 - 44 IU/L  CBC   Collection Time: 08/23/23  8:44 AM  Result Value Ref Range   WBC 7.6 3.4 - 10.8 x10E3/uL   RBC 4.57 4.14 - 5.80 x10E6/uL   Hemoglobin 14.9 13.0 - 17.7 g/dL   Hematocrit 55.5 62.4 - 51.0 %  MCV 97 79 - 97 fL   MCH 32.6 26.6 - 33.0 pg   MCHC 33.6 31.5 - 35.7 g/dL   RDW 88.0 88.3 - 84.5 %   Platelets 154 150 - 450 x10E3/uL  Hemoglobin A1c   Collection Time: 08/23/23  8:44 AM  Result Value Ref Range   Hgb A1c MFr Bld 5.8 (H) 4.8 - 5.6 %   Est. average glucose Bld gHb Est-mCnc 120 mg/dL  Joint Injection/Arthrocentesis  Date/Time: 10/08/2024 9:43 AM  Performed by: Milon Cleaves, PA Authorized by: Milon Cleaves, PA  Indications: pain  Location: Right Foot near calcaneal insertion. Local anesthesia used: yes  Anesthesia: Local anesthesia used: yes Local  Anesthetic: co-phenylcaine spray  Sedation: Patient sedated: no  Needle gauge: 23 G. Ultrasound guidance: no Approach: medial Aspirate: clear Aspirate amount: 1 mL Triamcinolone  amount: 20 mg Lidocaine 1% amount: 5 mL Patient tolerance: patient tolerated the procedure well with no immediate complications       Assessment & Plan:   Assessment & Plan Plantar fasciitis of right foot Right plantar fasciitis Acute exacerbation with severe pain unresponsive to Tylenol  and Voltaren gel. - Administer steroid injection with lidocaine to right foot. - Prescribe naproxen for 5 days, twice daily, then once daily for 3 days. - Prescribe omeprazole at night to reduce gastric irritation. - Instruct on ice therapy using a frozen water bottle and stretching exercises. - Consider physical therapy referral if no improvement in one week. Orders:   naproxen (NAPROSYN) 500 MG tablet; Take 1 tablet (500 mg total) by mouth 2 (two) times daily with a meal.  Gastroesophageal reflux disease without esophagitis  Gastric ulcer with prior bleeding History of gastric ulcer with prior bleeding. Discussed risk of NSAID use exacerbating ulcers. - Prescribe omeprazole at night while on naproxen. - Advise taking naproxen with food to minimize gastric irritation. Orders:   omeprazole (PRILOSEC) 40 MG capsule; Take 1 capsule (40 mg total) by mouth daily.   Body mass index is 27.44 kg/m.SABRA   No orders of the defined types were placed in this encounter.   No orders of the defined types were placed in this encounter.    Follow-up: No follow-ups on file.  An After Visit Summary was printed and given to the patient.    I,Lauren M Auman,acting as a Neurosurgeon for [employer], PA.,have documented all relevant documentation on the behalf of Cleaves Milon, PA,as directed by  Cleaves Milon, PA while in the presence of Cleaves Milon, [place].    Cleaves Milon, [place] [facility] [phone]

## 2024-10-08 NOTE — Assessment & Plan Note (Addendum)
  Gastric ulcer with prior bleeding History of gastric ulcer with prior bleeding. Discussed risk of NSAID use exacerbating ulcers. - Prescribe omeprazole at night while on naproxen. - Advise taking naproxen with food to minimize gastric irritation. Orders:   omeprazole (PRILOSEC) 40 MG capsule; Take 1 capsule (40 mg total) by mouth daily.

## 2024-10-18 ENCOUNTER — Ambulatory Visit: Payer: Self-pay

## 2024-10-18 DIAGNOSIS — H2513 Age-related nuclear cataract, bilateral: Secondary | ICD-10-CM | POA: Diagnosis not present

## 2024-10-18 NOTE — Telephone Encounter (Signed)
 FYI Only or Action Required?: FYI only for provider: 10/31 at 1040.  Patient was last seen in primary care on 10/08/2024 by Milon Cleaves, PA.  Called Nurse Triage reporting Toe Pain.  Symptoms began several days ago.  Interventions attempted: Prescription medications: Naproxen.  Symptoms are: gradually worsening.  Triage Disposition: See PCP When Office is Open (Within 3 Days)  Patient/caregiver understands and will follow disposition?: Yes  Copied from CRM [id number]. Topic: Clinical - Red Word Triage >> Oct 18, 2024  8:53 AM Charlet HERO wrote: Red Word that prompted transfer to Nurse Triage: Patient the on his right foot big toe is swollen with pain and redness , [facility] Reason for Disposition  Pain in the big toe joint  Answer Assessment - Initial Assessment Questions 1. ONSET: When did the pain start?      Started 2 days ago  2. LOCATION: Where is the pain located?   (e.g., around nail, entire toe, at foot joint)      Right great tow  3. PAIN: How bad is the pain?    (Scale 1-10; or mild, moderate, severe)     6/10  4. APPEARANCE: What does the toe look like? (e.g., redness, swelling, bruising, pallor)     Red and swollen  5. CAUSE: What do you think is causing the toe pain?     Unsure of cause  6. OTHER SYMPTOMS: Do you have any other symptoms? (e.g., leg pain, rash, fever, numbness)     No  7. PREGNANCY: Is there any chance you are pregnant? When was your last menstrual period?     No  Protocols used: Toe Pain-A-AH

## 2024-10-19 ENCOUNTER — Encounter: Payer: Self-pay | Admitting: Family Medicine

## 2024-10-19 ENCOUNTER — Ambulatory Visit (INDEPENDENT_AMBULATORY_CARE_PROVIDER_SITE_OTHER): Admitting: Family Medicine

## 2024-10-19 VITALS — BP 150/72 | HR 66 | Temp 98.0°F | Resp 18 | Ht 73.0 in | Wt 212.6 lb

## 2024-10-19 DIAGNOSIS — M79674 Pain in right toe(s): Secondary | ICD-10-CM | POA: Diagnosis not present

## 2024-10-19 DIAGNOSIS — M722 Plantar fascial fibromatosis: Secondary | ICD-10-CM | POA: Diagnosis not present

## 2024-10-19 MED ORDER — TRIAMCINOLONE ACETONIDE 40 MG/ML IJ SUSP
80.0000 mg | Freq: Once | INTRAMUSCULAR | Status: AC
  Administered 2024-10-19: 80 mg via INTRAMUSCULAR

## 2024-10-19 NOTE — Progress Notes (Signed)
 Acute Office Visit  Subjective:    Patient ID: Adrian Allen, male    DOB: [birth year], 73 y.o.   MRN: [id number]  Chief Complaint  Patient presents with   Toe Pain    Discussed the use of AI scribe software for clinical note transcription with the patient, who gave verbal consent to proceed.  History of Present Illness   Adrian Allen is a 73 year old who presents with a swollen and painful toe following a cortisone injection for plantar fasciitis.  Toe swelling and pain - Swelling, erythema, and pain localized to the toe joint, onset three days ago - Pain is tender to palpation but not to light touch - Swelling and pain have not improved since onset - Unable to wear regular shoes due to swelling; currently wearing flip flops - No burning, pruritus, or irritation at the site - No redness on the contralateral foot - No fever  Plantar fasciitis - Received cortisone injection in the foot approximately one week ago for plantar fasciitis - Initial improvement in heel pain following injection - Continues to take naproxen for plantar fasciitis  Past Medical History:  Diagnosis Date   Acute gastric ulcer with hemorrhage 06/24/2020   Arthritis    right ankle   Closed fracture of upper end of left fibula 09/23/2020   Closed nondisplaced fracture of body of right scapula 09/23/2020   Closed nondisplaced fracture of third metatarsal bone of left foot 09/23/2020   Gastroesophageal reflux disease 06/24/2020   Mixed conductive and sensorineural hearing loss, bilateral 06/24/2020   Mixed hyperlipidemia 06/24/2020    Past Surgical History:  Procedure Laterality Date   FRACTURE SURGERY     right ankle surgery in 1974   KNEE ARTHROSCOPY  2023   meniscal tears.   SKIN CANCER EXCISION Left 05/25/2020    Family History  Problem Relation Age of Onset   Febrile seizures Mother    Stomach cancer Father    Heart Problems Brother    Heart Problems Maternal Uncle    Birth defects  Maternal Uncle    Learning disabilities Maternal Grandmother     Social History   Socioeconomic History   Marital status: Married    Spouse name: Twilla   Number of children: Not on file   Years of education: Not on file   Highest education level: Not on file  Occupational History   Occupation: Retired  Tobacco Use   Smoking status: Former    Current packs/day: 0.00    Average packs/day: 1.5 packs/day for 48.0 years (72.0 ttl pk-yrs)    Types: Cigarettes    Start date: 05/20/1972    Quit date: 05/20/2020    Years since quitting: 4.4   Smokeless tobacco: Never  Vaping Use   Vaping status: Never Used  Substance and Sexual Activity   Alcohol use: Not Currently   Drug use: No   Sexual activity: Yes    Partners: Female  Other Topics Concern   Not on file  Social History Narrative   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   Social Drivers of Health   Financial Resource Strain: Low Risk  (08/23/2023)   Overall Financial Resource Strain (CARDIA)    Difficulty of Paying Living Expenses: Not hard at all  Food Insecurity: No Food Insecurity (08/23/2023)   Hunger Vital Sign    Worried About Running Out of Food in the Last Year: Never true    Ran Out of Food in  the Last Year: Never true  Transportation Needs: No Transportation Needs (08/23/2023)   PRAPARE - Administrator, Civil Service (Medical): No    Lack of Transportation (Non-Medical): No  Physical Activity: Sufficiently Active (08/23/2023)   Exercise Vital Sign    Days of Exercise per Week: 4 days    Minutes of Exercise per Session: 60 min  Stress: No Stress Concern Present (08/23/2023)   [employer] of Occupational Health - Occupational Stress Questionnaire    Feeling of Stress : Not at all  Social Connections: Moderately Integrated (08/23/2023)   Social Connection and Isolation Panel    Frequency of Communication with Friends and Family: More than three times a week    Frequency of Social  Gatherings with Friends and Family: More than three times a week    Attends Religious Services: More than 4 times per year    Active Member of [employer] or Organizations: No    Attends Banker Meetings: Never    Marital Status: Married  Catering Manager Violence: Not At Risk (08/23/2023)   Humiliation, Afraid, Rape, and Kick questionnaire    Fear of Current or Ex-Partner: No    Emotionally Abused: No    Physically Abused: No    Sexually Abused: No    Outpatient Medications Prior to Visit  Medication Sig Dispense Refill   naproxen (NAPROSYN) 500 MG tablet Take 1 tablet (500 mg total) by mouth 2 (two) times daily with a meal. 30 tablet 0   nitroGLYCERIN  (NITROSTAT ) 0.4 MG SL tablet Place 1 tablet (0.4 mg total) under the tongue every 5 (five) minutes as needed. 25 tablet 6   omeprazole (PRILOSEC) 40 MG capsule Take 1 capsule (40 mg total) by mouth daily. 30 capsule 3   rosuvastatin  (CRESTOR ) 40 MG tablet Take 1 tablet (40 mg total) by mouth daily. 90 tablet 1   No facility-administered medications prior to visit.    No Known Allergies  Review of Systems  Constitutional:  Negative for appetite change, fatigue and fever.  HENT:  Negative for congestion, ear pain, sinus pressure and sore throat.   Eyes: Negative.   Respiratory:  Negative for cough, chest tightness, shortness of breath and wheezing.   Cardiovascular:  Negative for chest pain and palpitations.  Gastrointestinal:  Negative for abdominal pain, constipation, diarrhea, nausea and vomiting.  Endocrine: Negative.   Genitourinary:  Negative for dysuria and hematuria.  Musculoskeletal:  Positive for arthralgias (right great toe) and joint swelling (right great toe). Negative for back pain and myalgias.  Skin:  Negative for rash.  Allergic/Immunologic: Negative.   Neurological:  Negative for dizziness, weakness, light-headedness and headaches.  Hematological: Negative.   Psychiatric/Behavioral:  Negative for dysphoric  mood. The patient is not nervous/anxious.        Objective:        10/19/2024   11:04 AM 10/08/2024    9:10 AM 08/23/2023    8:02 AM  Vitals with BMI  Height 6' 1 6' 1 6' 1  Weight 212 lbs 10 oz 208 lbs 202 lbs  BMI 28.06 27.45 26.66  Systolic 150 122 871  Diastolic 72 78 80  Pulse 66 74 62    No data found.   Physical Exam Vitals reviewed.  Constitutional:      General: He is not in acute distress.    Appearance: Normal appearance. He is not ill-appearing.  Eyes:     Conjunctiva/sclera: Conjunctivae normal.  Neck:     Vascular: No carotid  bruit.  Cardiovascular:     Rate and Rhythm: Normal rate and regular rhythm.     Heart sounds: Normal heart sounds. No murmur heard. Pulmonary:     Effort: Pulmonary effort is normal. No respiratory distress.     Breath sounds: Normal breath sounds. No wheezing.  Musculoskeletal:     Right foot: Swelling and tenderness present.     Left foot: Normal.  Neurological:     Mental Status: He is alert. Mental status is at baseline.  Psychiatric:        Mood and Affect: Mood normal.        Behavior: Behavior normal.     Health Maintenance Due  Topic Date Due   DTaP/Tdap/Td (1 - Tdap) Never done   Zoster Vaccines- Shingrix (1 of 2) Never done   Lung Cancer Screening  05/24/2024   COVID-19 Vaccine (4 - 2025-26 season) 08/20/2024   Medicare Annual Wellness (AWV)  08/22/2024    There are no preventive care reminders to display for this patient.   Lab Results  Component Value Date   TSH 1.970 02/07/2023   Lab Results  Component Value Date   WBC 7.6 08/23/2023   HGB 14.9 08/23/2023   HCT 44.4 08/23/2023   MCV 97 08/23/2023   PLT 154 08/23/2023   Lab Results  Component Value Date   NA 142 08/23/2023   K 5.7 (H) 08/23/2023   CO2 26 08/23/2023   GLUCOSE 96 08/23/2023   BUN 13 08/23/2023   CREATININE 1.39 (H) 08/23/2023   BILITOT 0.7 08/23/2023   ALKPHOS 110 08/23/2023   AST 26 08/23/2023   ALT 28 08/23/2023    PROT 6.7 08/23/2023   ALBUMIN 4.3 08/23/2023   CALCIUM  10.3 (H) 08/23/2023   EGFR 55 (L) 08/23/2023   Lab Results  Component Value Date   CHOL 184 08/23/2023   Lab Results  Component Value Date   HDL 44 08/23/2023   Lab Results  Component Value Date   LDLCALC 120 (H) 08/23/2023   Lab Results  Component Value Date   TRIG 112 08/23/2023   Lab Results  Component Value Date   CHOLHDL 4.2 08/23/2023   Lab Results  Component Value Date   HGBA1C 5.8 (H) 08/23/2023        Results for orders placed or performed in visit on 08/23/23  Lipid Panel   Collection Time: 08/23/23  8:44 AM  Result Value Ref Range   Cholesterol, Total 184 100 - 199 mg/dL   Triglycerides 887 0 - 149 mg/dL   HDL 44 >60 mg/dL   VLDL Cholesterol Cal 20 5 - 40 mg/dL   LDL Chol Calc (NIH) 879 (H) 0 - 99 mg/dL   Chol/HDL Ratio 4.2 0.0 - 5.0 ratio  Comprehensive metabolic panel   Collection Time: 08/23/23  8:44 AM  Result Value Ref Range   Glucose 96 70 - 99 mg/dL   BUN 13 8 - 27 mg/dL   Creatinine, Ser 8.60 (H) 0.76 - 1.27 mg/dL   eGFR 55 (L) >40 fO/fpw/8.26   BUN/Creatinine Ratio 9 (L) 10 - 24   Sodium 142 134 - 144 mmol/L   Potassium 5.7 (H) 3.5 - 5.2 mmol/L   Chloride 102 96 - 106 mmol/L   CO2 26 20 - 29 mmol/L   Calcium  10.3 (H) 8.6 - 10.2 mg/dL   Total Protein 6.7 6.0 - 8.5 g/dL   Albumin 4.3 3.9 - 4.9 g/dL   Globulin, Total 2.4 1.5 - 4.5 g/dL  Bilirubin Total 0.7 0.0 - 1.2 mg/dL   Alkaline Phosphatase 110 44 - 121 IU/L   AST 26 0 - 40 IU/L   ALT 28 0 - 44 IU/L  CBC   Collection Time: 08/23/23  8:44 AM  Result Value Ref Range   WBC 7.6 3.4 - 10.8 x10E3/uL   RBC 4.57 4.14 - 5.80 x10E6/uL   Hemoglobin 14.9 13.0 - 17.7 g/dL   Hematocrit 55.5 62.4 - 51.0 %   MCV 97 79 - 97 fL   MCH 32.6 26.6 - 33.0 pg   MCHC 33.6 31.5 - 35.7 g/dL   RDW 88.0 88.3 - 84.5 %   Platelets 154 150 - 450 x10E3/uL  Hemoglobin A1c   Collection Time: 08/23/23  8:44 AM  Result Value Ref Range   Hgb A1c MFr  Bld 5.8 (H) 4.8 - 5.6 %   Est. average glucose Bld gHb Est-mCnc 120 mg/dL     Assessment & Plan:   Assessment & Plan Great toe pain, right Acute right toe pain and swelling, possible infection or gout Acute right toe pain and swelling post-cortisone injection. Differential includes infection, gout, or cortisone reaction. No gout history or fever. Inflammation suggests infection or uric acid crystals. - Order CBC to assess for infection. - Order uric acid level for gout evaluation. - Administer IM steroid for potential gout. - Initiate antibiotics for infection or colchicine for gout based on labs. Orders:   CBC with Differential   Uric acid   triamcinolone  acetonide (KENALOG -40) injection 80 mg  Plantar fasciitis of right foot Plantar fasciitis, right foot Plantar fasciitis treated with cortisone injection, alleviated heel pain.       Follow-up: No follow-ups on file.  An After Visit Summary was printed and given to the patient.  Harrie Cedar, FNP [facility] [phone]

## 2024-10-19 NOTE — Assessment & Plan Note (Addendum)
 Acute right toe pain and swelling, possible infection or gout Acute right toe pain and swelling post-cortisone injection. Differential includes infection, gout, or cortisone reaction. No gout history or fever. Inflammation suggests infection or uric acid crystals. - Order CBC to assess for infection. - Order uric acid level for gout evaluation. - Administer IM steroid for potential gout. - Initiate antibiotics for infection or colchicine for gout based on labs. Orders:   CBC with Differential   Uric acid   triamcinolone  acetonide (KENALOG -40) injection 80 mg

## 2024-10-19 NOTE — Assessment & Plan Note (Signed)
 Plantar fasciitis, right foot Plantar fasciitis treated with cortisone injection, alleviated heel pain.

## 2024-10-20 ENCOUNTER — Ambulatory Visit: Payer: Self-pay | Admitting: Family Medicine

## 2024-10-20 LAB — CBC WITH DIFFERENTIAL/PLATELET
Basophils Absolute: 0.1 x10E3/uL (ref 0.0–0.2)
Basos: 1 %
EOS (ABSOLUTE): 0.2 x10E3/uL (ref 0.0–0.4)
Eos: 2 %
Hematocrit: 46.5 % (ref 37.5–51.0)
Hemoglobin: 15.4 g/dL (ref 13.0–17.7)
Immature Grans (Abs): 0.1 x10E3/uL (ref 0.0–0.1)
Immature Granulocytes: 1 %
Lymphocytes Absolute: 2 x10E3/uL (ref 0.7–3.1)
Lymphs: 21 %
MCH: 32.5 pg (ref 26.6–33.0)
MCHC: 33.1 g/dL (ref 31.5–35.7)
MCV: 98 fL — ABNORMAL HIGH (ref 79–97)
Monocytes Absolute: 0.8 x10E3/uL (ref 0.1–0.9)
Monocytes: 8 %
Neutrophils Absolute: 6.4 x10E3/uL (ref 1.4–7.0)
Neutrophils: 67 %
Platelets: 154 x10E3/uL (ref 150–450)
RBC: 4.74 x10E6/uL (ref 4.14–5.80)
RDW: 12.1 % (ref 11.6–15.4)
WBC: 9.6 x10E3/uL (ref 3.4–10.8)

## 2024-10-20 LAB — URIC ACID: Uric Acid: 7 mg/dL (ref 3.8–8.4)

## 2024-10-23 ENCOUNTER — Ambulatory Visit: Admitting: Podiatry

## 2024-11-20 ENCOUNTER — Ambulatory Visit: Admitting: Family Medicine

## 2024-11-20 ENCOUNTER — Encounter: Payer: Self-pay | Admitting: Family Medicine

## 2024-11-20 VITALS — BP 136/82 | HR 66 | Temp 98.2°F | Ht 73.0 in | Wt 209.0 lb

## 2024-11-20 DIAGNOSIS — E782 Mixed hyperlipidemia: Secondary | ICD-10-CM | POA: Diagnosis not present

## 2024-11-20 DIAGNOSIS — Z85828 Personal history of other malignant neoplasm of skin: Secondary | ICD-10-CM | POA: Diagnosis not present

## 2024-11-20 DIAGNOSIS — R7309 Other abnormal glucose: Secondary | ICD-10-CM | POA: Diagnosis not present

## 2024-11-20 DIAGNOSIS — I2510 Atherosclerotic heart disease of native coronary artery without angina pectoris: Secondary | ICD-10-CM | POA: Diagnosis not present

## 2024-11-20 DIAGNOSIS — M1A09X0 Idiopathic chronic gout, multiple sites, without tophus (tophi): Secondary | ICD-10-CM

## 2024-11-20 DIAGNOSIS — H906 Mixed conductive and sensorineural hearing loss, bilateral: Secondary | ICD-10-CM

## 2024-11-20 DIAGNOSIS — Z0000 Encounter for general adult medical examination without abnormal findings: Secondary | ICD-10-CM | POA: Diagnosis not present

## 2024-11-20 LAB — POCT LIPID PANEL
HDL: 54
LDL: 90
Non-HDL: 111
TC: 165
TRG: 101

## 2024-11-20 LAB — POCT GLYCOSYLATED HEMOGLOBIN (HGB A1C): HbA1c POC (<> result, manual entry): 5.8 % (ref 4.0–5.6)

## 2024-11-20 MED ORDER — ROSUVASTATIN CALCIUM 10 MG PO TABS: 10.0000 mg | ORAL_TABLET | Freq: Every day | ORAL | 0 refills | Status: AC

## 2024-11-20 NOTE — Assessment & Plan Note (Addendum)
 Routine wellness visit with no acute issues. Discussed general health maintenance, including diet, exercise, and vaccinations. - Continue healthy diet and regular exercise. - Recommended shingles vaccine at pharmacy. - Ensured pneumonia vaccine is up to date.

## 2024-11-20 NOTE — Progress Notes (Unsigned)
 Chief Complaint  Patient presents with   Medical Management of Chronic Issues     Subjective:   Adrian Allen is a 73 y.o. male who presents for a Medicare Annual Wellness Visit.  Visit info / Clinical Intake: Medicare Wellness Visit Type:: Subsequent Annual Wellness Visit Persons participating in visit and providing information:: patient Medicare Wellness Visit Mode:: In-person (required for WTM) Interpreter Needed?: No Pre-visit prep was completed: no AWV questionnaire completed by patient prior to visit?: no Living arrangements:: lives with spouse/significant other Patient's Overall Health Status Rating: very good Typical amount of pain: none Does pain affect daily life?: no Are you currently prescribed opioids?: no  Dietary Habits and Nutritional Risks How many meals a day?: 3 Eats fruit and vegetables daily?: yes Most meals are obtained by: preparing own meals In the last 2 weeks, have you had any of the following?: none Diabetic:: no  Functional Status Activities of Daily Living (to include ambulation/medication): Independent Ambulation: Independent Medication Administration: Independent Home Management (perform basic housework or laundry): Independent Manage your own finances?: yes Primary transportation is: driving Concerns about vision?: no *vision screening is required for WTM*  Fall Screening Falls in the past year?: 0 Number of falls in past year: 0 Was there an injury with Fall?: 0 Fall Risk Category Calculator: 0 Patient Fall Risk Level: Low Fall Risk  Fall Risk Patient at Risk for Falls Due to: No Fall Risks  Home and Transportation Safety: All rugs have non-skid backing?: yes All stairs or steps have railings?: N/A, no stairs Grab bars in the bathtub or shower?: yes Have non-skid surface in bathtub or shower?: yes Good home lighting?: yes Regular seat belt use?: yes  Cognitive Assessment Difficulty concentrating, remembering, or  making decisions? : no Will 6CIT or Mini Cog be Completed: no 6CIT or Mini Cog Declined: patient declined  Advance Directives (For Healthcare) Does Patient Have a Medical Advance Directive?: No Would patient like information on creating a medical advance directive?: No - Patient declined  Reviewed/Updated  Reviewed/Updated: Reviewed All (Medical, Surgical, Family, Medications, Allergies, Care Teams, Patient Goals)    Allergies (verified) Patient has no known allergies.   Current Medications (verified) Outpatient Encounter Medications as of 11/20/2024  Medication Sig   rosuvastatin  (CRESTOR ) 10 MG tablet Take 1 tablet (10 mg total) by mouth daily.   naproxen  (NAPROSYN ) 500 MG tablet Take 1 tablet (500 mg total) by mouth 2 (two) times daily with a meal.   omeprazole  (PRILOSEC) 40 MG capsule Take 1 capsule (40 mg total) by mouth daily.   [DISCONTINUED] nitroGLYCERIN  (NITROSTAT ) 0.4 MG SL tablet Place 1 tablet (0.4 mg total) under the tongue every 5 (five) minutes as needed.   [DISCONTINUED] rosuvastatin  (CRESTOR ) 40 MG tablet Take 1 tablet (40 mg total) by mouth daily.   No facility-administered encounter medications on file as of 11/20/2024.    History: Past Medical History:  Diagnosis Date   Acute gastric ulcer with hemorrhage 06/24/2020   Arthritis    right ankle   Closed fracture of upper end of left fibula 09/23/2020   Closed nondisplaced fracture of body of right scapula 09/23/2020   Closed nondisplaced fracture of third metatarsal bone of left foot 09/23/2020   Gastroesophageal reflux disease 06/24/2020   Mixed conductive and sensorineural hearing loss, bilateral 06/24/2020   Mixed hyperlipidemia 06/24/2020   Past Surgical History:  Procedure Laterality Date   FRACTURE SURGERY     right ankle surgery in 1974   KNEE ARTHROSCOPY  2023  meniscal tears.   SKIN CANCER EXCISION Left 05/25/2020   Family History  Problem Relation Age of Onset   Febrile seizures Mother    Stomach  cancer Father    Heart Problems Brother    Heart Problems Maternal Uncle    Birth defects Maternal Uncle    Learning disabilities Maternal Grandmother    Social History   Occupational History   Occupation: Retired  Tobacco Use   Smoking status: Former    Current packs/day: 0.00    Average packs/day: 1.5 packs/day for 48.0 years (72.0 ttl pk-yrs)    Types: Cigarettes    Start date: 05/20/1972    Quit date: 05/20/2020    Years since quitting: 4.5   Smokeless tobacco: Never  Vaping Use   Vaping status: Never Used  Substance and Sexual Activity   Alcohol use: Not Currently   Drug use: No   Sexual activity: Yes    Partners: Female   Tobacco Counseling Counseling given: Not Answered  SDOH Screenings   Food Insecurity: No Food Insecurity (11/20/2024)  Housing: Low Risk  (11/20/2024)  Transportation Needs: No Transportation Needs (11/20/2024)  Utilities: Not At Risk (11/20/2024)  Alcohol Screen: Low Risk  (11/20/2024)  Depression (PHQ2-9): Low Risk  (11/20/2024)  Financial Resource Strain: Low Risk  (11/20/2024)  Physical Activity: Sufficiently Active (11/20/2024)  Social Connections: Moderately Integrated (11/20/2024)  Stress: No Stress Concern Present (11/20/2024)  Tobacco Use: Medium Risk (11/20/2024)  Health Literacy: Adequate Health Literacy (11/20/2024)   See flowsheets for full screening details  Depression Screen PHQ 2 & 9 Depression Scale- Over the past 2 weeks, how often have you been bothered by any of the following problems? Little interest or pleasure in doing things: 0 Feeling down, depressed, or hopeless (PHQ Adolescent also includes...irritable): 0 PHQ-2 Total Score: 0       Objective:    Today's Vitals   11/20/24 1322  BP: 136/82  Pulse: 66  Temp: 98.2 F (36.8 C)  SpO2: 97%  Weight: 209 lb (94.8 kg)  Height: 6' 1 (1.854 m)  PainSc: 0-No pain   Body mass index is 27.57 kg/m.  Hearing/Vision screen No results found. Immunizations and Health  Maintenance Health Maintenance  Topic Date Due   DTaP/Tdap/Td (1 - Tdap) Never done   Zoster Vaccines- Shingrix (1 of 2) Never done   Lung Cancer Screening  05/24/2024   COVID-19 Vaccine (4 - 2025-26 season) 08/20/2024   Medicare Annual Wellness (AWV)  11/20/2025   Colonoscopy  12/28/2026   Pneumococcal Vaccine: 50+ Years  Completed   Influenza Vaccine  Completed   Hepatitis C Screening  Completed   Meningococcal B Vaccine  Aged Out        Assessment/Plan:  This is a routine wellness examination for [place].  Patient Care Team: Sherre Clapper, MD as PCP - General (Internal Medicine) Sherre Clapper, MD as Referring Physician (Internal Medicine) Gladis Leonor HERO, MD as Consulting Physician (Pulmonary Disease)  I have personally reviewed and noted the following in the patient's chart:   Medical and social history Use of alcohol, tobacco or illicit drugs  Current medications and supplements including opioid prescriptions. Functional ability and status Nutritional status Physical activity Advanced directives List of other physicians Hospitalizations, surgeries, and ER visits in previous 12 months Vitals Screenings to include cognitive, depression, and falls Referrals and appointments  Orders Placed This Encounter  Procedures   CBC with Differential/Platelet   Comprehensive metabolic panel with GFR   POCT Lipid Panel   POCT  glycosylated hemoglobin (Hb A1C)   In addition, I have reviewed and discussed with patient certain preventive protocols, quality metrics, and best practice recommendations. A written personalized care plan for preventive services as well as general preventive health recommendations were provided to patient.   Abigail Free, MD   11/25/2024   Return in 3 months (on 02/18/2025) for chronic follow up.

## 2024-11-20 NOTE — Assessment & Plan Note (Addendum)
 Glucose was 90 at last visit.  A1C drawn today. 5.8.  Orders:   POCT glycosylated hemoglobin (Hb A1C)

## 2024-11-20 NOTE — Assessment & Plan Note (Addendum)
 Managed with cholesterol medication. Liver function tests are due as the medication is metabolized in the liver. Importance of cholesterol management emphasized due to coronary artery disease. - Ordered liver function tests. - Continue cholesterol medication. Orders:   POCT Lipid Panel   CBC with Differential/Platelet   Comprehensive metabolic panel with GFR

## 2024-11-20 NOTE — Progress Notes (Unsigned)
 Subjective:  Patient ID: Adrian Allen, male    DOB: [birth year]  Age: 73 y.o. MRN: [id number]  Chief Complaint  Patient presents with   Medical Management of Chronic Issues    HPI: Discussed the use of AI scribe software for clinical note transcription with the patient, who gave verbal consent to proceed.  History of Present Illness Adrian Allen is a 73 year old with hyperlipidemia and gout who presents for follow-up on cholesterol management and recent gout flare.  Hyperlipidemia management - Has not had cholesterol levels checked in over a year - Has not refilled cholesterol medication since it was used up; previously on medication for a little over a year - Dietary changes include eating more regularly and healthily, focusing on fruits and vegetables with minimal meat consumption  Gout flare - Recent gout flare affecting the toe, now resolved - During the flare, experienced blistering and peeling of the skin over the affected joint - Uric acid level checked during episode, described as 'good' - Family history of gout; daughter also affected  Chronic cough - Persistent daily cough, primarily in the morning when throat feels dry - Cough resolves after hydration - No associated fever, chills, sweats, earaches, sore throat, stuffy nose, chest pain, breathing problems, bowel issues, abdominal pain, bladder issues, joint pain, back pain, muscle pain, headaches, or dizziness  Hearing loss - History of hearing issues; previous test at [facility] indicated hearing loss approximately five years ago - Does not currently use hearing aids  History of cutaneous malignancy - History of squamous cell carcinoma - Undergoes dermatology evaluation every six months for management  Tobacco cessation and lung cancer screening - Quit smoking in June 2021 - Undergoing annual lung cancer screenings since cessation  Medication use - Currently only taking acid reflux medication - Has  nitroglycerin  available but has not used it recently       11/20/2024    1:25 PM 08/23/2023    8:06 AM 05/10/2023    9:37 AM 02/07/2023    7:55 AM 08/12/2022    9:22 AM  Depression screen PHQ 2/9  Decreased Interest 0 0 0 0 0  Down, Depressed, Hopeless 0 0 0 0 0  PHQ - 2 Score 0 0 0 0 0        11/20/2024    1:26 PM  Fall Risk   Falls in the past year? 0  Number falls in past yr: 0  Injury with Fall? 0  Risk for fall due to : No Fall Risks    Patient Care Team: Sherre Clapper, MD as PCP - General (Internal Medicine) Sherre Clapper, MD as Referring Physician (Internal Medicine) Gladis Leonor HERO, MD as Consulting Physician (Pulmonary Disease)   Review of Systems  Constitutional:  Negative for chills, fatigue and fever.  HENT:  Negative for congestion, ear pain and sore throat.   Respiratory:  Negative for cough and shortness of breath.   Cardiovascular:  Negative for chest pain.  Gastrointestinal:  Negative for abdominal pain, constipation, diarrhea, nausea and vomiting.  Genitourinary:  Negative for dysuria and frequency.  Musculoskeletal:  Negative for arthralgias and myalgias.  Neurological:  Negative for dizziness and headaches.  Psychiatric/Behavioral:  Negative for dysphoric mood.        No dysphoria    Current Outpatient Medications on File Prior to Visit  Medication Sig Dispense Refill   naproxen  (NAPROSYN ) 500 MG tablet Take 1 tablet (500 mg total) by mouth 2 (two) times daily with a  meal. 30 tablet 0   omeprazole  (PRILOSEC) 40 MG capsule Take 1 capsule (40 mg total) by mouth daily. 30 capsule 3   No current facility-administered medications on file prior to visit.   Past Medical History:  Diagnosis Date   Acute gastric ulcer with hemorrhage 06/24/2020   Arthritis    right ankle   Closed fracture of upper end of left fibula 09/23/2020   Closed nondisplaced fracture of body of right scapula 09/23/2020   Closed nondisplaced fracture of third metatarsal bone of left  foot 09/23/2020   Gastroesophageal reflux disease 06/24/2020   Mixed conductive and sensorineural hearing loss, bilateral 06/24/2020   Mixed hyperlipidemia 06/24/2020   Past Surgical History:  Procedure Laterality Date   FRACTURE SURGERY     right ankle surgery in 1974   KNEE ARTHROSCOPY  2023   meniscal tears.   SKIN CANCER EXCISION Left 05/25/2020    Family History  Problem Relation Age of Onset   Febrile seizures Mother    Stomach cancer Father    Heart Problems Brother    Heart Problems Maternal Uncle    Birth defects Maternal Uncle    Learning disabilities Maternal Grandmother    Social History   Socioeconomic History   Marital status: Married    Spouse name: Twilla   Number of children: Not on file   Years of education: Not on file   Highest education level: Not on file  Occupational History   Occupation: Retired  Tobacco Use   Smoking status: Former    Current packs/day: 0.00    Average packs/day: 1.5 packs/day for 48.0 years (72.0 ttl pk-yrs)    Types: Cigarettes    Start date: 05/20/1972    Quit date: 05/20/2020    Years since quitting: 4.5   Smokeless tobacco: Never  Vaping Use   Vaping status: Never Used  Substance and Sexual Activity   Alcohol use: Not Currently   Drug use: No   Sexual activity: Yes    Partners: Female  Other Topics Concern   Not on file  Social History Narrative   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   Social Drivers of Health   Financial Resource Strain: Low Risk  (11/20/2024)   Overall Financial Resource Strain (CARDIA)    Difficulty of Paying Living Expenses: Not hard at all  Food Insecurity: No Food Insecurity (11/20/2024)   Hunger Vital Sign    Worried About Running Out of Food in the Last Year: Never true    Ran Out of Food in the Last Year: Never true  Transportation Needs: No Transportation Needs (11/20/2024)   PRAPARE - Administrator, Civil Service (Medical): No    Lack of Transportation  (Non-Medical): No  Physical Activity: Sufficiently Active (11/20/2024)   Exercise Vital Sign    Days of Exercise per Week: 4 days    Minutes of Exercise per Session: 60 min  Stress: No Stress Concern Present (11/20/2024)   [employer] of Occupational Health - Occupational Stress Questionnaire    Feeling of Stress: Not at all  Social Connections: Moderately Integrated (11/20/2024)   Social Connection and Isolation Panel    Frequency of Communication with Friends and Family: More than three times a week    Frequency of Social Gatherings with Friends and Family: More than three times a week    Attends Religious Services: More than 4 times per year    Active Member of Clubs or Organizations: No  Attends Banker Meetings: Never    Marital Status: Married    Objective:  BP 136/82   Pulse 66   Temp 98.2 F (36.8 C)   Ht 6' 1 (1.854 m)   Wt 209 lb (94.8 kg)   SpO2 97%   BMI 27.57 kg/m      11/20/2024    1:22 PM 10/19/2024   11:04 AM 10/08/2024    9:10 AM  BP/Weight  Systolic BP 136 150 122  Diastolic BP 82 72 78  Wt. (Lbs) 209 212.6 208  BMI 27.57 kg/m2 28.05 kg/m2 27.44 kg/m2    Physical Exam Vitals reviewed.  Constitutional:      Appearance: Normal appearance.  HENT:     Right Ear: Tympanic membrane normal.     Left Ear: Tympanic membrane normal.  Neck:     Vascular: No carotid bruit.  Cardiovascular:     Rate and Rhythm: Normal rate and regular rhythm.     Pulses: Normal pulses.     Heart sounds: Normal heart sounds.  Pulmonary:     Effort: Pulmonary effort is normal.     Breath sounds: Normal breath sounds. No wheezing, rhonchi or rales.  Abdominal:     General: Bowel sounds are normal.     Palpations: Abdomen is soft.     Tenderness: There is no abdominal tenderness.  Neurological:     Mental Status: He is alert.  Psychiatric:        Mood and Affect: Mood normal.        Behavior: Behavior normal.     {Perform Simple Foot Exam   Perform Detailed exam:1} {Insert foot Exam (Optional):30965}   Lab Results  Component Value Date   WBC 11.3 (H) 11/20/2024   HGB 15.8 11/20/2024   HCT 47.6 11/20/2024   PLT 161 11/20/2024   GLUCOSE 90 11/20/2024   CHOL 184 08/23/2023   TRIG 112 08/23/2023   HDL 44 08/23/2023   LDLCALC 120 (H) 08/23/2023   ALT 26 11/20/2024   AST 29 11/20/2024   NA 139 11/20/2024   K 4.7 11/20/2024   CL 100 11/20/2024   CREATININE 1.30 (H) 11/20/2024   BUN 11 11/20/2024   CO2 27 11/20/2024   TSH 1.970 02/07/2023   HGBA1C 5.8 11/20/2024    Results for orders placed or performed in visit on 11/20/24  POCT Lipid Panel   Collection Time: 11/20/24  1:49 PM  Result Value Ref Range   TC 165    HDL 54    TRG 101    LDL 90    Non-HDL 111    TC/HDL    POCT glycosylated hemoglobin (Hb A1C)   Collection Time: 11/20/24  1:49 PM  Result Value Ref Range   Hemoglobin A1C     HbA1c POC (<> result, manual entry) 5.8 4.0 - 5.6 %   HbA1c, POC (prediabetic range)     HbA1c, POC (controlled diabetic range)    CBC with Differential/Platelet   Collection Time: 11/20/24  1:54 PM  Result Value Ref Range   WBC 11.3 (H) 3.4 - 10.8 x10E3/uL   RBC 4.92 4.14 - 5.80 x10E6/uL   Hemoglobin 15.8 13.0 - 17.7 g/dL   Hematocrit 52.3 62.4 - 51.0 %   MCV 97 79 - 97 fL   MCH 32.1 26.6 - 33.0 pg   MCHC 33.2 31.5 - 35.7 g/dL   RDW 88.0 88.3 - 84.5 %   Platelets 161 150 - 450 x10E3/uL   Neutrophils  68 Not Estab. %   Lymphs 21 Not Estab. %   Monocytes 7 Not Estab. %   Eos 2 Not Estab. %   Basos 1 Not Estab. %   Neutrophils Absolute 7.8 (H) 1.4 - 7.0 x10E3/uL   Lymphocytes Absolute 2.3 0.7 - 3.1 x10E3/uL   Monocytes Absolute 0.8 0.1 - 0.9 x10E3/uL   EOS (ABSOLUTE) 0.2 0.0 - 0.4 x10E3/uL   Basophils Absolute 0.1 0.0 - 0.2 x10E3/uL   Immature Granulocytes 1 Not Estab. %   Immature Grans (Abs) 0.1 0.0 - 0.1 x10E3/uL  Comprehensive metabolic panel with GFR   Collection Time: 11/20/24  1:54 PM  Result Value Ref  Range   Glucose 90 70 - 99 mg/dL   BUN 11 8 - 27 mg/dL   Creatinine, Ser 8.69 (H) 0.76 - 1.27 mg/dL   eGFR 59 (L) >40 fO/fpw/8.26   BUN/Creatinine Ratio 8 (L) 10 - 24   Sodium 139 134 - 144 mmol/L   Potassium 4.7 3.5 - 5.2 mmol/L   Chloride 100 96 - 106 mmol/L   CO2 27 20 - 29 mmol/L   Calcium  9.4 8.6 - 10.2 mg/dL   Total Protein 7.0 6.0 - 8.5 g/dL   Albumin 4.2 3.8 - 4.8 g/dL   Globulin, Total 2.8 1.5 - 4.5 g/dL   Bilirubin Total 1.0 0.0 - 1.2 mg/dL   Alkaline Phosphatase 103 47 - 123 IU/L   AST 29 0 - 40 IU/L   ALT 26 0 - 44 IU/L  .  Assessment & Plan:   Assessment & Plan Encounter for Medicare annual wellness exam Routine wellness visit with no acute issues. Discussed general health maintenance, including diet, exercise, and vaccinations. - Continue healthy diet and regular exercise. - Recommended shingles vaccine at pharmacy. - Ensured pneumonia vaccine is up to date.    Mixed hyperlipidemia Managed with cholesterol medication. Liver function tests are due as the medication is metabolized in the liver. Importance of cholesterol management emphasized due to coronary artery disease. - Ordered liver function tests. - Continue cholesterol medication. Orders:   POCT Lipid Panel   CBC with Differential/Platelet   Comprehensive metabolic panel with GFR  Elevated glucose level Glucose was 90 at last visit.  A1C drawn today Await labs/testing for assessment and recommendations.  Orders:   POCT glycosylated hemoglobin (Hb A1C)   Coronary artery disease involving native heart, unspecified vessel or lesion type, unspecified whether angina present Previous CT scan showing distal main artery blockage. No current chest pain. Aspirin contraindicated due to previous ulcer bleed. Emphasized importance of cholesterol management to prevent progression of coronary artery disease. - Continue cholesterol medication. - Avoid aspirin due to ulcer history.    Hx of squamous cell  carcinoma of skin Squamous cell carcinoma. Regular dermatology follow-ups every six months with [facility]. Recent skin cancer excision and skin grafting noted. - Continue regular dermatology follow-ups every six months.    Mixed conductive and sensorineural hearing loss, bilateral Reported hearing loss with previous evaluation at the [facility] five years ago. No hearing aids recommended at that time. Discussed the importance of updating hearing evaluation and potential benefits of hearing aids. - Recommended hearing test at [facility] or local audiology center.    Idiopathic chronic gout of multiple sites without tophus Recent flare resolved. Uric acid levels were normal during the flare, which can occur as uric acid crystals move to the joint during a flare.    Body mass index is 27.57 kg/m.    Meds ordered  this encounter  Medications   rosuvastatin  (CRESTOR ) 10 MG tablet    Sig: Take 1 tablet (10 mg total) by mouth daily.    Dispense:  90 tablet    Refill:  0    Orders Placed This Encounter  Procedures   CBC with Differential/Platelet   Comprehensive metabolic panel with GFR   POCT Lipid Panel   POCT glycosylated hemoglobin (Hb A1C)     I,Marla I Leal-Borjas,acting as a scribe for Abigail Free, MD.,have documented all relevant documentation on the behalf of Abigail Free, MD,as directed by  Abigail Free, MD while in the presence of Abigail Free, MD.   Follow-up: Return in 3 months (on 02/18/2025) for chronic follow up.  An After Visit Summary was printed and given to the patient.  Abigail Free, MD [facility] [phone]

## 2024-11-21 ENCOUNTER — Ambulatory Visit: Payer: Self-pay | Admitting: Family Medicine

## 2024-11-21 LAB — COMPREHENSIVE METABOLIC PANEL WITH GFR
ALT: 26 IU/L (ref 0–44)
AST: 29 IU/L (ref 0–40)
Albumin: 4.2 g/dL (ref 3.8–4.8)
Alkaline Phosphatase: 103 IU/L (ref 47–123)
BUN/Creatinine Ratio: 8 — ABNORMAL LOW (ref 10–24)
BUN: 11 mg/dL (ref 8–27)
Bilirubin Total: 1 mg/dL (ref 0.0–1.2)
CO2: 27 mmol/L (ref 20–29)
Calcium: 9.4 mg/dL (ref 8.6–10.2)
Chloride: 100 mmol/L (ref 96–106)
Creatinine, Ser: 1.3 mg/dL — ABNORMAL HIGH (ref 0.76–1.27)
Globulin, Total: 2.8 g/dL (ref 1.5–4.5)
Glucose: 90 mg/dL (ref 70–99)
Potassium: 4.7 mmol/L (ref 3.5–5.2)
Sodium: 139 mmol/L (ref 134–144)
Total Protein: 7 g/dL (ref 6.0–8.5)
eGFR: 59 mL/min/1.73 — ABNORMAL LOW (ref >59)

## 2024-11-21 LAB — CBC WITH DIFFERENTIAL/PLATELET
Basophils Absolute: 0.1 x10E3/uL (ref 0.0–0.2)
Basos: 1 %
EOS (ABSOLUTE): 0.2 x10E3/uL (ref 0.0–0.4)
Eos: 2 %
Hematocrit: 47.6 % (ref 37.5–51.0)
Hemoglobin: 15.8 g/dL (ref 13.0–17.7)
Immature Grans (Abs): 0.1 x10E3/uL (ref 0.0–0.1)
Immature Granulocytes: 1 %
Lymphocytes Absolute: 2.3 x10E3/uL (ref 0.7–3.1)
Lymphs: 21 %
MCH: 32.1 pg (ref 26.6–33.0)
MCHC: 33.2 g/dL (ref 31.5–35.7)
MCV: 97 fL (ref 79–97)
Monocytes Absolute: 0.8 x10E3/uL (ref 0.1–0.9)
Monocytes: 7 %
Neutrophils Absolute: 7.8 x10E3/uL — ABNORMAL HIGH (ref 1.4–7.0)
Neutrophils: 68 %
Platelets: 161 x10E3/uL (ref 150–450)
RBC: 4.92 x10E6/uL (ref 4.14–5.80)
RDW: 11.9 % (ref 11.6–15.4)
WBC: 11.3 x10E3/uL — ABNORMAL HIGH (ref 3.4–10.8)

## 2024-11-24 DIAGNOSIS — I2510 Atherosclerotic heart disease of native coronary artery without angina pectoris: Secondary | ICD-10-CM | POA: Insufficient documentation

## 2024-11-24 DIAGNOSIS — M1A09X0 Idiopathic chronic gout, multiple sites, without tophus (tophi): Secondary | ICD-10-CM | POA: Insufficient documentation

## 2024-11-24 DIAGNOSIS — Z85828 Personal history of other malignant neoplasm of skin: Secondary | ICD-10-CM | POA: Insufficient documentation

## 2024-11-24 NOTE — Assessment & Plan Note (Signed)
 Reported hearing loss with previous evaluation at the [facility] five years ago. No hearing aids recommended at that time. Discussed the importance of updating hearing evaluation and potential benefits of hearing aids. - Recommended hearing test at [facility] or local audiology center.

## 2024-11-24 NOTE — Assessment & Plan Note (Signed)
 Squamous cell carcinoma. Regular dermatology follow-ups every six months with [facility]. Recent skin cancer excision and skin grafting noted. - Continue regular dermatology follow-ups every six months.

## 2024-11-24 NOTE — Assessment & Plan Note (Signed)
 Recent flare resolved. Uric acid levels were normal during the flare, which can occur as uric acid crystals move to the joint during a flare.

## 2024-11-24 NOTE — Assessment & Plan Note (Signed)
 Previous CT scan showing distal main artery blockage. No current chest pain. Aspirin contraindicated due to previous ulcer bleed. Emphasized importance of cholesterol management to prevent progression of coronary artery disease. - Restart cholesterol medication. - Avoiding aspirin due to ulcer history.

## 2024-11-30 ENCOUNTER — Ambulatory Visit: Payer: Self-pay

## 2024-11-30 NOTE — Telephone Encounter (Signed)
 Please advise.[email or web address] cough, nasal congetion... CRM attached or moe info.

## 2024-11-30 NOTE — Telephone Encounter (Signed)
 FYI Only or Action Required?: Action required by provider: clinical question for provider.  Patient was last seen in primary care on 11/20/2024 by Sherre Clapper, MD.  Called Nurse Triage reporting Cough, Nasal Congestion, and Fever.  Symptoms began 3 days ago.  Interventions attempted: Other: Neti Pot.  Symptoms are: gradually worsening.  Triage Disposition: Home Care  Patient/caregiver understands and will follow disposition?: Yes             Message from [facility] sent at 11/30/2024  8:11 AM EST  Reason for Triage: Head cold - moving to chest.. stuffy, fever, cough - gravely cough   Reason for Disposition  Common cold with no complications  Answer Assessment - Initial Assessment Questions 1. ONSET: When did the nasal discharge start?      X 3 days.  2. AMOUNT: How much discharge is there?      Has been treating with Neti Pot and moderate amount of nasal drainage came out.  3. COUGH: Do you have a cough? If Yes, ask: Describe the color of your mucus. (e.g., clear, white, yellow, green)     Yes, white and clear.  4. RESPIRATORY DISTRESS: Describe your breathing.      Normal, no SOB.  5. FEVER: Do you have a fever? If Yes, ask: What is your temperature, how was it measured, and when did it start?     He thinks he did, did not check but felt feverish and sweaty last night and this AM.  6. SEVERITY: Overall, how bad are you feeling right now? (e.g., doesn't interfere with normal activities, staying home from school/work, staying in bed)      Patient able to get up and do yard work yesterday.  7. OTHER SYMPTOMS: Do you have any other symptoms? (e.g., earache, mouth sores, sore throat, wheezing)     No chest pain or SOB, earaches, sore throat. Denies any known exposure to flu or COVID.  Protocols used: Common Cold-A-AH

## 2024-11-30 NOTE — Telephone Encounter (Signed)
 RECOMMEND CORICEDIN OTC.  I SPOKE WITH PATIENT. DR. SHERRE

## 2024-12-05 ENCOUNTER — Ambulatory Visit: Payer: Self-pay | Admitting: Family Medicine

## 2024-12-05 ENCOUNTER — Ambulatory Visit: Admitting: Family Medicine

## 2024-12-05 ENCOUNTER — Ambulatory Visit (HOSPITAL_BASED_OUTPATIENT_CLINIC_OR_DEPARTMENT_OTHER)
Admission: RE | Admit: 2024-12-05 | Discharge: 2024-12-05 | Disposition: A | Discharge status: Home / Self Care | Source: Ambulatory Visit | Attending: Family Medicine | Admitting: Family Medicine

## 2024-12-05 VITALS — BP 134/74 | HR 81 | Temp 98.2°F | Ht 73.0 in | Wt 207.0 lb

## 2024-12-05 DIAGNOSIS — J189 Pneumonia, unspecified organism: Secondary | ICD-10-CM | POA: Diagnosis not present

## 2024-12-05 DIAGNOSIS — J40 Bronchitis, not specified as acute or chronic: Secondary | ICD-10-CM | POA: Insufficient documentation

## 2024-12-05 DIAGNOSIS — M10071 Idiopathic gout, right ankle and foot: Secondary | ICD-10-CM | POA: Diagnosis not present

## 2024-12-05 MED ORDER — BENZONATATE 200 MG PO CAPS: 200.0000 mg | ORAL_CAPSULE | Freq: Three times a day (TID) | ORAL | 0 refills | Status: AC | PRN

## 2024-12-05 MED ORDER — TRIAMCINOLONE ACETONIDE 40 MG/ML IJ SUSP
80.0000 mg | Freq: Once | INTRAMUSCULAR | Status: AC
  Administered 2024-12-05: 10:00:00 80 mg via INTRAMUSCULAR

## 2024-12-05 MED ORDER — AZITHROMYCIN 250 MG PO TABS: ORAL_TABLET | ORAL | 0 refills | Status: AC

## 2024-12-05 MED ORDER — FLUTICASONE PROPIONATE 50 MCG/ACT NA SUSP: 2.0000 | Freq: Every day | NASAL | 6 refills | Status: AC

## 2024-12-05 MED ORDER — AMOXICILLIN-POT CLAVULANATE 875-125 MG PO TABS: 1.0000 | ORAL_TABLET | Freq: Two times a day (BID) | ORAL | 0 refills | Status: AC

## 2024-12-05 NOTE — Progress Notes (Unsigned)
 Subjective:  Patient ID: Adrian Allen, male    DOB: 1951-07-30  Age: 73 y.o. MRN: [id number]  Chief Complaint  Patient presents with   Cough    Congestion since last Monday. Has been congested and coughing for the past 1.5 weeks. Believes cough is worse, coricidin is not helping.    HPI: Discussed the use of AI scribe soft ware for clinical note transcription with the patient, who gave verbal consent to proceed.  History of Present Illness Adrian Allen is a 73 year old with early signs of emphysema who presents with a persistent cough and upper respiratory symptoms.  Cough - Deep, persistent, severe, and repetitive cough for 1.5 weeks - Cough initially worsened with medication use - Chloraseptic provided no relief and seemed to exacerbate symptoms - No pain with deep breathing - No significant shortness of breath  Upper respiratory symptoms - Stuffy head and headache - Significant nasal drainage - Nasal congestion causing inability to breathe through nose - Uses neti pot four times daily and nasal spray for congestion - No significant fever  Pulmonary history - Early signs of emphysema diagnosed by pulmonary specialist - Concern regarding cough due to underlying emphysema - No prior use of Tessalon  Perles     Complaining of gout flare in right great toe.       11/20/2024    1:25 PM 08/23/2023    8:06 AM 05/10/2023    9:37 AM 02/07/2023    7:55 AM 08/12/2022    9:22 AM  Depression screen PHQ 2/9  Decreased Interest 0 0 0 0 0  Down, Depressed, Hopeless 0 0 0 0 0  PHQ - 2 Score 0 0 0 0 0        11/20/2024    1:26 PM  Fall Risk   Falls in the past year? 0  Number falls in past yr: 0  Injury with Fall? 0  Risk for fall due to : No Fall Risks    Patient Care Team: Sherre Clapper, MD as PCP - General (Internal Medicine) Sherre Clapper, MD as Referring Physician (Internal Medicine) Gladis Leonor HERO, MD as Consulting Physician (Pulmonary Disease)    Review of Systems  Constitutional:  Positive for fatigue. Negative for chills and fever.  HENT:  Positive for congestion. Negative for ear pain and sore throat.   Respiratory:  Positive for cough. Negative for shortness of breath.   Cardiovascular:  Negative for chest pain.  Musculoskeletal:  Positive for arthralgias.  Psychiatric/Behavioral:         No dysphoria    Medications Ordered Prior to Encounter[1] Past Medical History:  Diagnosis Date   Acute gastric ulcer with hemorrhage 06/24/2020   Arthritis    right ankle   Closed fracture of upper end of left fibula 09/23/2020   Closed nondisplaced fracture of body of right scapula 09/23/2020   Closed nondisplaced fracture of third metatarsal bone of left foot 09/23/2020   Gastroesophageal reflux disease 06/24/2020   Mixed conductive and sensorineural hearing loss, bilateral 06/24/2020   Mixed hyperlipidemia 06/24/2020   Past Surgical History:  Procedure Laterality Date   FRACTURE SURGERY     right ankle surgery in 1974   KNEE ARTHROSCOPY  2023   meniscal tears.   SKIN CANCER EXCISION Left 05/25/2020    Family History  Problem Relation Age of Onset   Febrile seizures Mother    Stomach cancer Father    Heart Problems Brother    Heart Problems Maternal Uncle  Birth defects Maternal Uncle    Learning disabilities Maternal Grandmother    Social History   Socioeconomic History   Marital status: Married    Spouse name: Twilla   Number of children: Not on file   Years of education: Not on file   Highest education level: Not on file  Occupational History   Occupation: Retired  Tobacco Use   Smoking status: Former    Current packs/day: 0.00    Average packs/day: 1.5 packs/day for 48.0 years (72.0 ttl pk-yrs)    Types: Cigarettes    Start date: 05/20/1972    Quit date: 05/20/2020    Years since quitting: 4.5   Smokeless tobacco: Never  Vaping Use   Vaping status: Never Used  Substance and Sexual Activity   Alcohol use: Not  Currently   Drug use: No   Sexual activity: Yes    Partners: Female  Other Topics Concern   Not on file  Social History Narrative   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   Social Drivers of Health   Tobacco Use: Medium Risk (11/20/2024)   Patient History    Smoking Tobacco Use: Former    Smokeless Tobacco Use: Never    Passive Exposure: Not on Actuary Strain: Low Risk (11/20/2024)   Overall Financial Resource Strain (CARDIA)    Difficulty of Paying Living Expenses: Not hard at all  Food Insecurity: No Food Insecurity (11/20/2024)   Epic    Worried About Radiation Protection Practitioner of Food in the Last Year: Never true    Ran Out of Food in the Last Year: Never true  Transportation Needs: No Transportation Needs (11/20/2024)   Epic    Lack of Transportation (Medical): No    Lack of Transportation (Non-Medical): No  Physical Activity: Sufficiently Active (11/20/2024)   Exercise Vital Sign    Days of Exercise per Week: 4 days    Minutes of Exercise per Session: 60 min  Stress: No Stress Concern Present (11/20/2024)   [employer] of Occupational Health - Occupational Stress Questionnaire    Feeling of Stress: Not at all  Social Connections: Moderately Integrated (11/20/2024)   Social Connection and Isolation Panel    Frequency of Communication with Friends and Family: More than three times a week    Frequency of Social Gatherings with Friends and Family: More than three times a week    Attends Religious Services: More than 4 times per year    Active Member of Clubs or Organizations: No    Attends Banker Meetings: Never    Marital Status: Married  Depression (PHQ2-9): Low Risk (11/20/2024)   Depression (PHQ2-9)    PHQ-2 Score: 0  Alcohol Screen: Low Risk (11/20/2024)   Alcohol Screen    Last Alcohol Screening Score (AUDIT): 0  Housing: Low Risk (11/20/2024)   Epic    Unable to Pay for Housing in the Last Year: No    Number of Times  Moved in the Last Year: 0    Homeless in the Last Year: No  Utilities: Not At Risk (11/20/2024)   Epic    Threatened with loss of utilities: No  Health Literacy: Adequate Health Literacy (11/20/2024)   B1300 Health Literacy    Frequency of need for help with medical instructions: Never    Objective:  BP 134/74   Pulse 81   Temp 98.2 F (36.8 C)   Ht 6' 1 (1.854 m)   Wt 207 lb (93.9  kg)   SpO2 97%   BMI 27.31 kg/m      12/05/2024    9:41 AM 11/20/2024    1:22 PM 10/19/2024   11:04 AM  BP/Weight  Systolic BP 134 136 150  Diastolic BP 74 82 72  Wt. (Lbs) 207 209 212.6  BMI 27.31 kg/m2 27.57 kg/m2 28.05 kg/m2    Physical Exam Constitutional:      Appearance: Normal appearance.  HENT:     Right Ear: Tympanic membrane, ear canal and external ear normal.     Left Ear: Tympanic membrane, ear canal and external ear normal.     Nose: Congestion present. No rhinorrhea.     Mouth/Throat:     Mouth: Mucous membranes are moist.     Pharynx: No oropharyngeal exudate or posterior oropharyngeal erythema.  Cardiovascular:     Rate and Rhythm: Normal rate and regular rhythm.     Heart sounds: Normal heart sounds.  Pulmonary:     Effort: Pulmonary effort is normal. No respiratory distress.     Breath sounds: Normal breath sounds. No wheezing, rhonchi or rales.  Abdominal:     General: Bowel sounds are normal.     Palpations: Abdomen is soft.     Tenderness: There is no abdominal tenderness.  Musculoskeletal:     Comments: RIGHT GREAT TOE ERYTHEMATOUS, WARM, MILDLY TENDER.   Lymphadenopathy:     Cervical: No cervical adenopathy.  Neurological:     Mental Status: He is alert.  Psychiatric:        Mood and Affect: Mood normal.        Behavior: Behavior normal.     {Perform Simple Foot Exam  Perform Detailed exam:1} {Insert foot Exam (Optional):30965}   Lab Results  Component Value Date   WBC 11.3 (H) 11/20/2024   HGB 15.8 11/20/2024   HCT 47.6 11/20/2024   PLT 161  11/20/2024   GLUCOSE 90 11/20/2024   CHOL 184 08/23/2023   TRIG 112 08/23/2023   HDL 44 08/23/2023   LDLCALC 120 (H) 08/23/2023   ALT 26 11/20/2024   AST 29 11/20/2024   NA 139 11/20/2024   K 4.7 11/20/2024   CL 100 11/20/2024   CREATININE 1.30 (H) 11/20/2024   BUN 11 11/20/2024   CO2 27 11/20/2024   TSH 1.970 02/07/2023   HGBA1C 5.8 11/20/2024    Results for orders placed or performed in visit on 11/20/24  POCT Lipid Panel   Collection Time: 11/20/24  1:49 PM  Result Value Ref Range   TC 165    HDL 54    TRG 101    LDL 90    Non-HDL 111    TC/HDL    POCT glycosylated hemoglobin (Hb A1C)   Collection Time: 11/20/24  1:49 PM  Result Value Ref Range   Hemoglobin A1C     HbA1c POC (<> result, manual entry) 5.8 4.0 - 5.6 %   HbA1c, POC (prediabetic range)     HbA1c, POC (controlled diabetic range)    CBC with Differential/Platelet   Collection Time: 11/20/24  1:54 PM  Result Value Ref Range   WBC 11.3 (H) 3.4 - 10.8 x10E3/uL   RBC 4.92 4.14 - 5.80 x10E6/uL   Hemoglobin 15.8 13.0 - 17.7 g/dL   Hematocrit 52.3 62.4 - 51.0 %   MCV 97 79 - 97 fL   MCH 32.1 26.6 - 33.0 pg   MCHC 33.2 31.5 - 35.7 g/dL   RDW 88.0 88.3 - 84.5 %  Platelets 161 150 - 450 x10E3/uL   Neutrophils 68 Not Estab. %   Lymphs 21 Not Estab. %   Monocytes 7 Not Estab. %   Eos 2 Not Estab. %   Basos 1 Not Estab. %   Neutrophils Absolute 7.8 (H) 1.4 - 7.0 x10E3/uL   Lymphocytes Absolute 2.3 0.7 - 3.1 x10E3/uL   Monocytes Absolute 0.8 0.1 - 0.9 x10E3/uL   EOS (ABSOLUTE) 0.2 0.0 - 0.4 x10E3/uL   Basophils Absolute 0.1 0.0 - 0.2 x10E3/uL   Immature Granulocytes 1 Not Estab. %   Immature Grans (Abs) 0.1 0.0 - 0.1 x10E3/uL  Comprehensive metabolic panel with GFR   Collection Time: 11/20/24  1:54 PM  Result Value Ref Range   Glucose 90 70 - 99 mg/dL   BUN 11 8 - 27 mg/dL   Creatinine, Ser 8.69 (H) 0.76 - 1.27 mg/dL   eGFR 59 (L) >40 fO/fpw/8.26   BUN/Creatinine Ratio 8 (L) 10 - 24   Sodium 139 134  - 144 mmol/L   Potassium 4.7 3.5 - 5.2 mmol/L   Chloride 100 96 - 106 mmol/L   CO2 27 20 - 29 mmol/L   Calcium  9.4 8.6 - 10.2 mg/dL   Total Protein 7.0 6.0 - 8.5 g/dL   Albumin 4.2 3.8 - 4.8 g/dL   Globulin, Total 2.8 1.5 - 4.5 g/dL   Bilirubin Total 1.0 0.0 - 1.2 mg/dL   Alkaline Phosphatase 103 47 - 123 IU/L   AST 29 0 - 40 IU/L   ALT 26 0 - 44 IU/L  .  Assessment & Plan:   Assessment & Plan Bronchitis     Pneumonia due to infectious organism, unspecified laterality, unspecified part of lung  Orders:   DG Chest 2 View; Future   benzonatate  (TESSALON ) 200 MG capsule; Take 1 capsule (200 mg total) by mouth 3 (three) times daily as needed for cough.   amoxicillin -clavulanate (AUGMENTIN ) 875-125 MG tablet; Take 1 tablet by mouth 2 (two) times daily.   azithromycin  (ZITHROMAX ) 250 MG tablet; Take 2 tablets on day 1, then 1 tablet daily on days 2 through 5  Acute idiopathic gout involving toe of right foot  Orders:   triamcinolone  acetonide (KENALOG -40) injection 80 mg    Body mass index is 27.31 kg/m.  Assessment and Plan Assessment & Plan Presumed pneumonia Persistent cough, nasal congestion, and headache with minimal fever. Concern for pneumonia due to prolonged symptoms and emphysema history. Differential includes pneumonia. Chest x-ray warranted. - Ordered chest x-ray. - Prescribed two antibiotics. - Prescribed Tessalon  Perles for cough. - Prescribed Flonase  for congestion. - Sent prescriptions to [facility].  Recording duration: 6 minutes     Meds ordered this encounter  Medications   benzonatate  (TESSALON ) 200 MG capsule    Sig: Take 1 capsule (200 mg total) by mouth 3 (three) times daily as needed for cough.    Dispense:  30 capsule    Refill:  0   fluticasone  (FLONASE ) 50 MCG/ACT nasal spray    Sig: Place 2 sprays into both nostrils daily.    Dispense:  16 g    Refill:  6   triamcinolone  acetonide (KENALOG -40) injection 80 mg    amoxicillin -clavulanate (AUGMENTIN ) 875-125 MG tablet    Sig: Take 1 tablet by mouth 2 (two) times daily.    Dispense:  20 tablet    Refill:  0   azithromycin  (ZITHROMAX ) 250 MG tablet    Sig: Take 2 tablets on day 1, then 1 tablet  daily on days 2 through 5    Dispense:  6 tablet    Refill:  0    Orders Placed This Encounter  Procedures   DG Chest 2 View       Follow-up: No follow-ups on file.  An After Visit Summary was printed and given to the patient.  Abigail Free, MD [facility] [phone]    [1]  Current Outpatient Medications on File Prior to Visit  Medication Sig Dispense Refill   naproxen  (NAPROSYN ) 500 MG tablet Take 1 tablet (500 mg total) by mouth 2 (two) times daily with a meal. 30 tablet 0   omeprazole  (PRILOSEC) 40 MG capsule Take 1 capsule (40 mg total) by mouth daily. 30 capsule 3   rosuvastatin  (CRESTOR ) 10 MG tablet Take 1 tablet (10 mg total) by mouth daily. 90 tablet 0   No current facility-administered medications on file prior to visit.

## 2024-12-05 NOTE — Assessment & Plan Note (Signed)
°  Orders:   DG Chest 2 View; Future   benzonatate  (TESSALON ) 200 MG capsule; Take 1 capsule (200 mg total) by mouth 3 (three) times daily as needed for cough.   amoxicillin -clavulanate (AUGMENTIN ) 875-125 MG tablet; Take 1 tablet by mouth 2 (two) times daily.   azithromycin  (ZITHROMAX ) 250 MG tablet; Take 2 tablets on day 1, then 1 tablet daily on days 2 through 5

## 2024-12-05 NOTE — Patient Instructions (Addendum)
°  VISIT SUMMARY: You came in today with a persistent cough and upper respiratory symptoms. Given your history of early emphysema, we are concerned about the possibility of pneumonia.  YOUR PLAN: PRESUMED PNEUMONIA: You have a persistent cough, nasal congestion, and headache with minimal fever. Given your history of early emphysema, we are concerned about pneumonia. -We have ordered a chest x-ray to check for pneumonia. -You have been prescribed two antibiotics to treat the infection. Augmentin  and zpack.  -You have been prescribed Tessalon  Perles to help with your cough. -You have been prescribed Flonase  to help with your nasal congestion. -All prescriptions have been sent to [facility].  GOUT: - GIVEN KENALOG  80 MG SHOT.   [facility] [address], [place], [place] [address] [phone]  IMAGING 8 AM-7 PM [facility] THROUGH FRIDAY.                        Contains text generated by Abridge.                                 Contains text generated by Abridge.

## 2024-12-05 NOTE — Assessment & Plan Note (Signed)
 Adrian Allen

## 2024-12-05 NOTE — Assessment & Plan Note (Signed)
Orders:    triamcinolone acetonide (KENALOG-40) injection 80 mg

## 2024-12-09 ENCOUNTER — Encounter: Payer: Self-pay | Admitting: Family Medicine

## 2025-02-21 ENCOUNTER — Ambulatory Visit: Admitting: Family Medicine
